# Patient Record
Sex: Female | Born: 1980
Health system: Southern US, Community
[De-identification: ages and names within clinical notes are randomized; demographics above are authoritative.]

## PROBLEM LIST (undated history)

## (undated) DIAGNOSIS — G4730 Sleep apnea, unspecified: Secondary | ICD-10-CM

## (undated) DIAGNOSIS — D219 Benign neoplasm of connective and other soft tissue, unspecified: Secondary | ICD-10-CM

## (undated) DIAGNOSIS — R519 Headache, unspecified: Secondary | ICD-10-CM

## (undated) DIAGNOSIS — R7303 Prediabetes: Secondary | ICD-10-CM

## (undated) DIAGNOSIS — I10 Essential (primary) hypertension: Secondary | ICD-10-CM

## (undated) HISTORY — DX: Morbid (severe) obesity due to excess calories: E66.01

## (undated) HISTORY — PX: NO PAST SURGERIES: SHX2092

## (undated) HISTORY — DX: Benign neoplasm of connective and other soft tissue, unspecified: D21.9

---

## 2016-01-11 ENCOUNTER — Emergency Department (HOSPITAL_COMMUNITY): Payer: Medicaid Other

## 2016-01-11 ENCOUNTER — Emergency Department (HOSPITAL_COMMUNITY)
Admission: EM | Admit: 2016-01-11 | Discharge: 2016-01-11 | Disposition: A | Discharge status: Home / Self Care | Payer: Medicaid Other | Attending: Emergency Medicine | Admitting: Emergency Medicine

## 2016-01-11 ENCOUNTER — Encounter (HOSPITAL_COMMUNITY): Payer: Self-pay | Admitting: *Deleted

## 2016-01-11 DIAGNOSIS — I10 Essential (primary) hypertension: Secondary | ICD-10-CM | POA: Diagnosis not present

## 2016-01-11 DIAGNOSIS — R079 Chest pain, unspecified: Secondary | ICD-10-CM

## 2016-01-11 DIAGNOSIS — Z79899 Other long term (current) drug therapy: Secondary | ICD-10-CM | POA: Diagnosis not present

## 2016-01-11 HISTORY — DX: Essential (primary) hypertension: I10

## 2016-01-11 LAB — BASIC METABOLIC PANEL
Anion gap: 5 (ref 5–15)
BUN: 17 mg/dL (ref 6–20)
CALCIUM: 8.7 mg/dL — AB (ref 8.9–10.3)
CHLORIDE: 103 mmol/L (ref 101–111)
CO2: 28 mmol/L (ref 22–32)
CREATININE: 1.12 mg/dL — AB (ref 0.44–1.00)
Glucose, Bld: 93 mg/dL (ref 65–99)
Potassium: 4 mmol/L (ref 3.5–5.1)
SODIUM: 136 mmol/L (ref 135–145)

## 2016-01-11 LAB — CBC
HCT: 37.1 % (ref 36.0–46.0)
Hemoglobin: 12.1 g/dL (ref 12.0–15.0)
MCH: 25.6 pg — ABNORMAL LOW (ref 26.0–34.0)
MCHC: 32.6 g/dL (ref 30.0–36.0)
MCV: 78.6 fL (ref 78.0–100.0)
Platelets: 294 10*3/uL (ref 150–400)
RBC: 4.72 MIL/uL (ref 3.87–5.11)
RDW: 14.3 % (ref 11.5–15.5)
WBC: 7.6 10*3/uL (ref 4.0–10.5)

## 2016-01-11 LAB — I-STAT TROPONIN, ED: Troponin i, poc: 0.01 ng/mL (ref 0.00–0.08)

## 2016-01-11 MED ORDER — KETOROLAC TROMETHAMINE 30 MG/ML IJ SOLN
15.0000 mg | Freq: Once | INTRAMUSCULAR | Status: AC
  Administered 2016-01-11: 15 mg via INTRAVENOUS
  Filled 2016-01-11: qty 1

## 2016-01-11 MED ORDER — NAPROXEN 500 MG PO TABS: 500.0000 mg | ORAL_TABLET | Freq: Two times a day (BID) | ORAL | Status: DC

## 2016-01-11 NOTE — ED Notes (Signed)
Pt reports L cp with SOB which started earlier today.  Pt reports pain is non-radiating and describes pain as sharp shooting pain.

## 2016-01-11 NOTE — ED Provider Notes (Signed)
CSN: [id number]     Arrival date & time 01/11/16  1812 History   First MD Initiated Contact with Patient 01/11/16 1932     Chief Complaint  Patient presents with  . Chest Pain     (Consider location/radiation/quality/duration/timing/severity/associated sxs/prior Treatment) HPI  Patient is a 35 year old female with past medical history hypertension and hyperlipidemia who presents the ED with complaint of left sided chest pain, onset 5 AM. Patient reports having a gradual onset of constant sharp left-sided chest pain which she notes has worsened throughout the day. She notes the pain is worse with movement or taking a deep breath. Pt reports feeling SOB due to her CP worsening when taking a deep breath. Denies radiation. Patient reports she has had similar chest pain in the past and notes she was [place] the [place] state, denies any known cause of her CP. Denies fever, chills, headache, lightheadedness, dizziness, diaphoresis, cough, wheezing, abdominal pain, N/V, numbness, tingling, weakness, leg swelling. Denies smoking. Endorses family hx of heart disease.   Past Medical History  Diagnosis Date  . Hypertension    History reviewed. No pertinent past surgical history. No family history on file. Social History  Substance Use Topics  . Smoking status: Never Smoker   . Smokeless tobacco: None  . Alcohol Use: No   OB History    No data available     Review of Systems  Respiratory: Positive for shortness of breath (due to CP).   Cardiovascular: Positive for chest pain.  All other systems reviewed and are negative.     Allergies  Review of patient's allergies indicates no known allergies.  Home Medications   Prior to Admission medications   Medication Sig Start Date End Date Taking? Authorizing Provider  naproxen (NAPROSYN) 500 MG tablet Take 1 tablet (500 mg total) by mouth 2 (two) times daily. 01/11/16   Chesley Noon Ketra Duchesne, PA-C   BP 144/92 mmHg  Pulse 65   Temp(Src) 97.7 F (36.5 C) (Oral)  Resp 20  Ht 5\' 3"  (1.6 m)  Wt 117.935 kg  BMI 46.07 kg/m2  SpO2 98%  LMP 12/12/2015 Physical Exam  Constitutional: She is oriented to person, place, and time. She appears well-developed and well-nourished. No distress.  Morbidly obese female  HENT:  Head: Normocephalic and atraumatic.  Mouth/Throat: Oropharynx is clear and moist. No oropharyngeal exudate.  Eyes: Conjunctivae and EOM are normal. Right eye exhibits no discharge. Left eye exhibits no discharge. No scleral icterus.  Neck: Normal range of motion. Neck supple.  Cardiovascular: Normal rate, regular rhythm, normal heart sounds and intact distal pulses.   Pulmonary/Chest: Effort normal and breath sounds normal. No respiratory distress. She has no wheezes. She has no rales. She exhibits tenderness (TTP over left mid-sternum).  Abdominal: Soft. Bowel sounds are normal. There is no tenderness.  Musculoskeletal: Normal range of motion. She exhibits no edema.  Neurological: She is alert and oriented to person, place, and time.  Skin: Skin is warm and dry. She is not diaphoretic.  Nursing note and vitals reviewed.   ED Course  Procedures (including critical care time) Labs Review Labs Reviewed  BASIC METABOLIC PANEL - Abnormal; Notable for the following:    Creatinine, Ser 1.12 (*)    Calcium 8.7 (*)    All other components within normal limits  CBC - Abnormal; Notable for the following:    MCH 25.6 (*)    All other components within normal limits  Randolm Idol, ED  Imaging Review Dg Chest 2 View  01/11/2016  CLINICAL DATA:  Shortness of breath and left-sided chest pain, initial encounter EXAM: CHEST  2 VIEW COMPARISON:  None. FINDINGS: The heart size and mediastinal contours are within normal limits. Both lungs are clear. The visualized skeletal structures are unremarkable. IMPRESSION: No active cardiopulmonary disease. Electronically Signed   By: Inez Catalina M.D.   On: 01/11/2016  18:44   I have personally reviewed and evaluated these images and lab results as part of my medical decision-making.   EKG Interpretation None      MDM   Final diagnoses:  Chest pain, unspecified chest pain type    Patient presented with left-sided chest pain with associated shortness of breath due to pain, pain worse with movement. History of hypertension, high cholesterol and obesity. Endorses family hx of heart disease. VSS. Exam revealed tenderness palpation over left anterior chest wall, remaining exam unremarkable. EKG showed sinus tachycardia. Troponin negative. Cr 1.12, no prior values to compare to see if this is pt's baseline value, remaining labs unremarkable. CXR negative. HEART score 2. PERC negative. I have a low suspicion for ACS, PE, dissection, or other acute cardiac event at this time. Pt given toradol. On reevaluation patient reports her chest pain has improved. Discussed results and plan for discharge with patient. Plan to discharge patient home with NSAIDs and symptomatically treatment. Patient given information to follow up with PCP and cardiology outpatient regarding her mildly elevated Cr and chest pain.     Chesley Noon [place], [place] 01/11/16 2100  Daleen Bo, MD 01/12/16 1226

## 2016-01-11 NOTE — Discharge Instructions (Signed)
Take your medication as prescribed as needed for your chest pain. I recommend refraining from lifting or doing any particular movements/activities that worsen your chest pain. Please follow up with a primary care provider from the Resource Guide provided below in 5-7 days if your chest pain has not improved. I also recommend following up with the cardiology clinic listed above if your chest pain continues for further evaluation. Please return to the Emergency Department if symptoms worsen or new onset of fever, headache, lightheadedness, dizziness, visual changes, difficulty breathing, productive cough, vomiting, numbness, tingling, weakness, syncope.

## 2016-12-18 ENCOUNTER — Emergency Department (HOSPITAL_COMMUNITY)
Admission: EM | Admit: 2016-12-18 | Discharge: 2016-12-18 | Disposition: A | Discharge status: Home / Self Care | Payer: Medicaid Other | Attending: Emergency Medicine | Admitting: Emergency Medicine

## 2016-12-18 ENCOUNTER — Encounter (HOSPITAL_COMMUNITY): Payer: Self-pay | Admitting: Emergency Medicine

## 2016-12-18 DIAGNOSIS — G4489 Other headache syndrome: Secondary | ICD-10-CM

## 2016-12-18 DIAGNOSIS — I10 Essential (primary) hypertension: Secondary | ICD-10-CM | POA: Insufficient documentation

## 2016-12-18 DIAGNOSIS — R51 Headache: Secondary | ICD-10-CM | POA: Diagnosis present

## 2016-12-18 LAB — BASIC METABOLIC PANEL
ANION GAP: 8 (ref 5–15)
BUN: 16 mg/dL (ref 6–20)
CALCIUM: 8.8 mg/dL — AB (ref 8.9–10.3)
CO2: 28 mmol/L (ref 22–32)
CREATININE: 1.04 mg/dL — AB (ref 0.44–1.00)
Chloride: 104 mmol/L (ref 101–111)
GFR calc Af Amer: >60 mL/min (ref >60)
GLUCOSE: 90 mg/dL (ref 65–99)
Potassium: 3.7 mmol/L (ref 3.5–5.1)
Sodium: 140 mmol/L (ref 135–145)

## 2016-12-18 LAB — CBC WITH DIFFERENTIAL/PLATELET
BASOS ABS: 0 10*3/uL (ref 0.0–0.1)
Basophils Relative: 0 %
EOS PCT: 2 %
Eosinophils Absolute: 0.2 10*3/uL (ref 0.0–0.7)
HCT: 38.6 % (ref 36.0–46.0)
Hemoglobin: 12.3 g/dL (ref 12.0–15.0)
LYMPHS PCT: 28 %
Lymphs Abs: 1.9 10*3/uL (ref 0.7–4.0)
MCH: 25.9 pg — AB (ref 26.0–34.0)
MCHC: 31.9 g/dL (ref 30.0–36.0)
MCV: 81.4 fL (ref 78.0–100.0)
MONO ABS: 0.3 10*3/uL (ref 0.1–1.0)
MONOS PCT: 5 %
NEUTROS ABS: 4.5 10*3/uL (ref 1.7–7.7)
Neutrophils Relative %: 65 %
Platelets: 278 10*3/uL (ref 150–400)
RBC: 4.74 MIL/uL (ref 3.87–5.11)
RDW: 14.5 % (ref 11.5–15.5)
WBC: 6.9 10*3/uL (ref 4.0–10.5)

## 2016-12-18 MED ORDER — METOCLOPRAMIDE HCL 5 MG/ML IJ SOLN
10.0000 mg | Freq: Once | INTRAMUSCULAR | Status: AC
  Administered 2016-12-18: 10 mg via INTRAVENOUS
  Filled 2016-12-18: qty 2

## 2016-12-18 MED ORDER — SODIUM CHLORIDE 0.9 % IV SOLN: INTRAVENOUS | Status: DC

## 2016-12-18 MED ORDER — LORAZEPAM 2 MG/ML IJ SOLN
0.5000 mg | Freq: Once | INTRAMUSCULAR | Status: AC
  Administered 2016-12-18: 0.5 mg via INTRAVENOUS
  Filled 2016-12-18: qty 1

## 2016-12-18 MED ORDER — DIPHENHYDRAMINE HCL 50 MG/ML IJ SOLN
12.5000 mg | Freq: Once | INTRAMUSCULAR | Status: AC
  Administered 2016-12-18: 12.5 mg via INTRAVENOUS
  Filled 2016-12-18: qty 1

## 2016-12-18 NOTE — ED Notes (Signed)
rn will collect labs at IV start 

## 2016-12-18 NOTE — ED Triage Notes (Signed)
Patient c/o headache, sinus pain and around eyes since yesterday. patient reports eyes have clear drainage.

## 2016-12-18 NOTE — ED Provider Notes (Signed)
[facility] DEPT Provider Note   CSN: [id number] Arrival date & time: 12/18/16  7412     History   Chief Complaint Chief Complaint  Patient presents with  . headache    HPI Rhodie Cienfuegos is a 36 y.o. female.  36 year old female presents with headache which is been left-sided 2 days. Headache starts on the left side of her face and goes on to her neck. Does have a prior history of migraines and she states that this is similar. Denies any confusion. Patient denies any visual loss or neck pain. Has had some photophobia but denies any phonophobia. Denies any weakness to her arms or legs. Headache has been persistent and nothing seems to make it better or worse. No treatment use prior to arrival.      Past Medical History:  Diagnosis Date  . Hypertension     There are no active problems to display for this patient.   History reviewed. No pertinent surgical history.  OB History    No data available       Home Medications    Prior to Admission medications   Medication Sig Start Date End Date Taking? Authorizing Provider  acetaminophen (TYLENOL) 500 MG tablet Take 1,000 mg by mouth every 6 (six) hours as needed (headache).   Yes Historical Provider, MD  levonorgestrel (MIRENA, 52 MG,) 20 MCG/24HR IUD 1 each by Intrauterine route once.   Yes Historical Provider, MD    Family History No family history on file.  Social History Social History  Substance Use Topics  . Smoking status: Never Smoker  . Smokeless tobacco: Never Used  . Alcohol use No     Allergies   Patient has no known allergies.   Review of Systems Review of Systems  All other systems reviewed and are negative.    Physical Exam Updated Vital Signs BP (!) 200/110 (BP Location: Right Arm)   Pulse 80   Temp 97.5 F (36.4 C) (Oral)   Resp 19   LMP 12/13/2016   SpO2 100%   Physical Exam  Constitutional: She is oriented to person, place, and time. She appears well-developed and  well-nourished.  Non-toxic appearance. No distress.  HENT:  Head: Normocephalic and atraumatic.  Eyes: Conjunctivae, EOM and lids are normal. Pupils are equal, round, and reactive to light.  Neck: Normal range of motion. Neck supple. No tracheal deviation present. No thyroid mass present.  Cardiovascular: Normal rate, regular rhythm and normal heart sounds.  Exam reveals no gallop.   No murmur heard. Pulmonary/Chest: Effort normal and breath sounds normal. No stridor. No respiratory distress. She has no decreased breath sounds. She has no wheezes. She has no rhonchi. She has no rales.  Abdominal: Soft. Normal appearance and bowel sounds are normal. She exhibits no distension. There is no tenderness. There is no rebound and no CVA tenderness.  Musculoskeletal: Normal range of motion. She exhibits no edema or tenderness.  Neurological: She is alert and oriented to person, place, and time. She has normal strength. No cranial nerve deficit or sensory deficit. GCS eye subscore is 4. GCS verbal subscore is 5. GCS motor subscore is 6.  Skin: Skin is warm and dry. No abrasion and no rash noted.  Psychiatric: She has a normal mood and affect. Her speech is normal and behavior is normal.  Nursing note and vitals reviewed.    ED Treatments / Results  Labs (all labs ordered are listed, but only abnormal results are displayed) Labs Reviewed  CBC WITH  DIFFERENTIAL/PLATELET  BASIC METABOLIC PANEL    EKG  EKG Interpretation None       Radiology No results found.  Procedures Procedures (including critical care time)  Medications Ordered in ED Medications  0.9 %  sodium chloride infusion (not administered)  metoCLOPramide (REGLAN) injection 10 mg (not administered)  diphenhydrAMINE (BENADRYL) injection 12.5 mg (not administered)  LORazepam (ATIVAN) injection 0.5 mg (not administered)     Initial Impression / Assessment and Plan / ED Course  I have reviewed the triage vital signs and the  nursing notes.  Pertinent labs & imaging results that were available during my care of the patient were reviewed by me and considered in my medical decision making (see chart for details).     Patient's headache treated here and she feels better. Suspect that she has a migraine. Will be discharged home  Final Clinical Impressions(s) / ED Diagnoses   Final diagnoses:  None    New Prescriptions New Prescriptions   No medications on file     Lacretia Leigh, MD 12/18/16 1225

## 2016-12-19 ENCOUNTER — Encounter: Payer: Self-pay | Admitting: Family Medicine

## 2016-12-19 ENCOUNTER — Ambulatory Visit (INDEPENDENT_AMBULATORY_CARE_PROVIDER_SITE_OTHER): Payer: Self-pay | Admitting: Family Medicine

## 2016-12-19 VITALS — BP 145/82 | HR 98 | Temp 98.0°F | Resp 16 | Ht 63.0 in | Wt 337.0 lb

## 2016-12-19 DIAGNOSIS — IMO0001 Reserved for inherently not codable concepts without codable children: Secondary | ICD-10-CM

## 2016-12-19 DIAGNOSIS — R030 Elevated blood-pressure reading, without diagnosis of hypertension: Secondary | ICD-10-CM

## 2016-12-19 DIAGNOSIS — E669 Obesity, unspecified: Secondary | ICD-10-CM

## 2016-12-19 DIAGNOSIS — Z8669 Personal history of other diseases of the nervous system and sense organs: Secondary | ICD-10-CM

## 2016-12-19 DIAGNOSIS — Z6841 Body Mass Index (BMI) 40.0 and over, adult: Secondary | ICD-10-CM

## 2016-12-19 DIAGNOSIS — Z23 Encounter for immunization: Secondary | ICD-10-CM

## 2016-12-19 LAB — POCT URINALYSIS DIP (DEVICE)
BILIRUBIN URINE: NEGATIVE
Glucose, UA: NEGATIVE mg/dL
Ketones, ur: NEGATIVE mg/dL
Nitrite: NEGATIVE
PH: 5.5 (ref 5.0–8.0)
PROTEIN: NEGATIVE mg/dL
Specific Gravity, Urine: 1.025 (ref 1.005–1.030)
UROBILINOGEN UA: 0.2 mg/dL (ref 0.0–1.0)

## 2016-12-19 LAB — LIPID PANEL
CHOL/HDL RATIO: 4 ratio (ref <5.0)
Cholesterol: 144 mg/dL (ref <200)
HDL: 36 mg/dL — AB (ref >50)
LDL Cholesterol: 89 mg/dL (ref <100)
Triglycerides: 97 mg/dL (ref <150)
VLDL: 19 mg/dL (ref <30)

## 2016-12-19 LAB — THYROID PANEL WITH TSH
Free Thyroxine Index: 2.4 (ref 1.4–3.8)
T3 Uptake: 28 % (ref 22–35)
T4, Total: 8.4 ug/dL (ref 4.5–12.0)
TSH: 0.94 m[IU]/L

## 2016-12-19 MED ORDER — SUMATRIPTAN SUCCINATE 100 MG PO TABS: 100.0000 mg | ORAL_TABLET | ORAL | 0 refills | Status: DC | PRN

## 2016-12-19 MED FILL — SUMATRIPTAN SUCC 100 MG TAB: 100 | 30 days supply | Qty: 10 | Fill #0

## 2016-12-19 NOTE — Progress Notes (Signed)
Patient ID: Chloe Castillo, female    DOB: [birth year], 36 y.o.   MRN: [id number]  PCP: Scot Jun, FNP  Chief Complaint  Patient presents with  . Establish Care    hospr f/u    Subjective:  HPI Chloe Castillo is a 36 y.o. female presents to establish care and hospital follow-up. 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  Chloe Castillo presented to [facility] Emergency Department 12/18/16 for a persistent headache x 2 days. She was evaluated and treated for a migraine headache, and discharged that same day.  While at the emergency department ,Chloe Castillo's blood pressure was elevated. She reports a history of gestational hypertension for which she was treated with antihypertension medication for 1 year. Prior to recent elevated blood pressure reading recorded in the ED, Chloe Castillo denies knowledge of any other elevated readings. She reports a positive family history of hypertension with both parents medically managed for blood pressure management.  Headaches Chloe Castillo was treated in her early 20's for migraine headaches. In the past she has experienced sensitivity to light, nausea, without vomiting. Headaches typically relieved with motrin and tylenol.  She has never taken any prescribed medication for headache prevention or abortive therapy. Since being discharged from the emergency department on 12/18/16, she has not experienced another headache.   Social History   Social History  . Marital status: Single    Spouse name: N/A  . Number of children: N/A  . Years of education: N/A   Occupational History  . Not on file.   Social History Main Topics  . Smoking status: Never Smoker  . Smokeless tobacco: Never Used  . Alcohol use No  . Drug use: No  . Sexual activity: Not on file   Other Topics Concern  . Not on file   Social History Narrative  . No narrative on file   Review of Systems See HPI  Prior to Admission  medications   Medication Sig Start Date End Date Taking? Authorizing Provider  acetaminophen (TYLENOL) 500 MG tablet Take 1,000 mg by mouth every 6 (six) hours as needed (headache).   Yes Historical Provider, MD  levonorgestrel (MIRENA, 52 MG,) 20 MCG/24HR IUD 1 each by Intrauterine route once.   Yes Historical Provider, MD    Past Medical, Surgical Family and Social History reviewed and updated.    Objective:   Blood pressure (!) 145/82, pulse 98, temperature 98 F (36.7 C), temperature source Oral, resp. rate 16, height 5\' 3"  (1.6 m), weight (!) 337 lb (152.9 kg), last menstrual period 12/13/2016, SpO2 98 %.  Wt Readings from Last 3 Encounters:  12/19/16 (!) 337 lb (152.9 kg)  01/11/16 260 lb (117.9 kg)   Physical Exam  Constitutional: She is oriented to person, place, and time. She appears well-developed and well-nourished.  HENT:  Head: Normocephalic and atraumatic.  Nose: Nose normal.  Mouth/Throat: Oropharynx is clear and moist.  Eyes: Conjunctivae and EOM are normal. Pupils are equal, round, and reactive to light.  Neck: Normal range of motion. Neck supple. Thyromegaly present.  Cardiovascular: Normal rate, regular rhythm, normal heart sounds and intact distal pulses.   Pulmonary/Chest: Effort normal and breath sounds normal.  Abdominal: Soft. Bowel sounds are normal.  Musculoskeletal: Normal range of motion.  Neurological: She is alert and oriented to person, place, and time.  Skin: Skin is warm and dry.  Psychiatric: She has a normal mood and affect. Her behavior is normal. Judgment and thought content  normal.    Assessment & Plan:  1. Elevated blood pressure reading without diagnosis of hypertension - Hemoglobin A1c - Thyroid Panel With TSH - Lipid panel  2. Hx of migraine headaches -for acute onset of headaches, take Sumatriptan 100 mg PRN as needed. Not exceed more than 200 mg within 2 hours.  3. Class 3 obesity with body mass index (BMI) of 45.0 to 49.9 in  adult, unspecified obesity type, unspecified whether serious comorbidity present (HCC) -Increase physical activity   4. Need for diphtheria-tetanus-pertussis (Tdap) vaccine - Tdap vaccine greater than or equal to 7yo IM   RTC: 1 week for blood pressure recheck 3 months for PAP, re-evaluate headaches, and evaluate blood pressure  Carroll Sage. Kenton Kingfisher, MSN, [facility] [address] [place], [place] [address] [phone]

## 2016-12-19 NOTE — Patient Instructions (Addendum)
Take sumatriptan as prescribed for headaches.  Return in 3 months for pap and blood pressure follow-up.  Return in 1 week for blood pressure recheck.

## 2016-12-20 LAB — HEMOGLOBIN A1C
Hgb A1c MFr Bld: 5.8 % — ABNORMAL HIGH (ref <5.7)
MEAN PLASMA GLUCOSE: 120 mg/dL

## 2016-12-24 DIAGNOSIS — Z6841 Body Mass Index (BMI) 40.0 and over, adult: Secondary | ICD-10-CM

## 2016-12-24 DIAGNOSIS — R030 Elevated blood-pressure reading, without diagnosis of hypertension: Secondary | ICD-10-CM | POA: Insufficient documentation

## 2016-12-24 DIAGNOSIS — Z8669 Personal history of other diseases of the nervous system and sense organs: Secondary | ICD-10-CM | POA: Insufficient documentation

## 2016-12-25 ENCOUNTER — Ambulatory Visit: Payer: Self-pay | Admitting: Family Medicine

## 2016-12-25 VITALS — BP 100/72

## 2016-12-25 DIAGNOSIS — Z0130 Encounter for examination of blood pressure without abnormal findings: Secondary | ICD-10-CM

## 2017-01-31 ENCOUNTER — Encounter (HOSPITAL_COMMUNITY): Payer: Self-pay | Admitting: Emergency Medicine

## 2017-01-31 ENCOUNTER — Emergency Department (HOSPITAL_COMMUNITY)
Admission: EM | Admit: 2017-01-31 | Discharge: 2017-01-31 | Disposition: A | Discharge status: Home / Self Care | Payer: Self-pay | Attending: Emergency Medicine | Admitting: Emergency Medicine

## 2017-01-31 ENCOUNTER — Emergency Department (HOSPITAL_COMMUNITY): Payer: Self-pay

## 2017-01-31 DIAGNOSIS — M545 Low back pain, unspecified: Secondary | ICD-10-CM

## 2017-01-31 DIAGNOSIS — M62830 Muscle spasm of back: Secondary | ICD-10-CM | POA: Insufficient documentation

## 2017-01-31 DIAGNOSIS — Y9389 Activity, other specified: Secondary | ICD-10-CM | POA: Insufficient documentation

## 2017-01-31 DIAGNOSIS — I10 Essential (primary) hypertension: Secondary | ICD-10-CM | POA: Insufficient documentation

## 2017-01-31 DIAGNOSIS — Y929 Unspecified place or not applicable: Secondary | ICD-10-CM | POA: Insufficient documentation

## 2017-01-31 DIAGNOSIS — Y999 Unspecified external cause status: Secondary | ICD-10-CM | POA: Insufficient documentation

## 2017-01-31 DIAGNOSIS — S8392XA Sprain of unspecified site of left knee, initial encounter: Secondary | ICD-10-CM | POA: Insufficient documentation

## 2017-01-31 DIAGNOSIS — W19XXXA Unspecified fall, initial encounter: Secondary | ICD-10-CM

## 2017-01-31 DIAGNOSIS — S93401A Sprain of unspecified ligament of right ankle, initial encounter: Secondary | ICD-10-CM | POA: Insufficient documentation

## 2017-01-31 DIAGNOSIS — W010XXA Fall on same level from slipping, tripping and stumbling without subsequent striking against object, initial encounter: Secondary | ICD-10-CM | POA: Insufficient documentation

## 2017-01-31 MED ORDER — NAPROXEN 500 MG PO TABS: 500.0000 mg | ORAL_TABLET | Freq: Two times a day (BID) | ORAL | 0 refills | Status: DC | PRN

## 2017-01-31 MED ORDER — IBUPROFEN 800 MG PO TABS
800.0000 mg | ORAL_TABLET | Freq: Once | ORAL | Status: AC
  Administered 2017-01-31: 800 mg via ORAL
  Filled 2017-01-31: qty 1

## 2017-01-31 MED ORDER — CYCLOBENZAPRINE HCL 10 MG PO TABS: 10.0000 mg | ORAL_TABLET | Freq: Three times a day (TID) | ORAL | 0 refills | Status: DC | PRN

## 2017-01-31 NOTE — ED Provider Notes (Signed)
[facility] DEPT Provider Note   CSN: [id number] Arrival date & time: 01/31/17  1607  By signing my name below, I, Ephriam Jenkins, attest that this documentation has been prepared under the direction and in the presence of [employer]. Electronically Signed: Ephriam Jenkins, ED Scribe. 01/31/17. 5:26 PM.   History   Chief Complaint Chief Complaint  Patient presents with  . Fall  . Ankle Pain  . Back Pain  . Knee Pain    HPI HPI Comments: Chloe Castillo is a 36 y.o. female who presents to the Emergency Department complaining of right ankle, left knee, and lower back pain s/p 2 separate falls in the last 1.5wks, with the most recent one being 3 days ago. Pt states she was walking out of the freezer at her work when she slipped and fell which caused her legs to split, landing on her L knee extended behind her, and her R ankle extended forward. She states her ankle hurts the worst, which she describes as 10/10 sharp constant nonradiating R ankle pain, worse with bearing weight on the extremity, with no tx tried for any of her symptoms. She reports mild swelling in her L knee and R ankle. She further reports lower back pain only when bending over. Pt has not taken any medication in attempt to relieve her symptoms. She is not currently taking blood thinners. She denies head injury/LOC, bruising, wounds, fevers, chills, CP, SOB, abd pain, N/V/D/C, hematuria, dysuria, incontinence of urine/stool, saddle anesthesia/cauda equina symptoms, myalgias, numbness, tingling, focal weakness, or any other complaints at this time.    The history is provided by the patient and medical records. No language interpreter was used.  Ankle Pain   The incident occurred more than 2 days ago (3 days). The incident occurred at work. The injury mechanism was a fall. The pain is present in the right ankle and left knee. The quality of the pain is described as sharp. The pain is at a severity of 10/10. The pain is  moderate. The pain has been constant since onset. Pertinent negatives include no numbness, no inability to bear weight, no loss of motion, no muscle weakness, no loss of sensation and no tingling. She reports no foreign bodies present. The symptoms are aggravated by activity and bearing weight. She has tried nothing for the symptoms. The treatment provided no relief.    Past Medical History:  Diagnosis Date  . Hypertension     Patient Active Problem List   Diagnosis Date Noted  . Elevated blood pressure reading without diagnosis of hypertension 12/24/2016  . Hx of migraine headaches 12/24/2016  . Class 3 obesity with body mass index (BMI) of 45.0 to 49.9 in adult [facility]) 12/24/2016    History reviewed. No pertinent surgical history.  OB History    No data available       Home Medications    Prior to Admission medications   Medication Sig Start Date End Date Taking? Authorizing Provider  acetaminophen (TYLENOL) 500 MG tablet Take 1,000 mg by mouth every 6 (six) hours as needed (headache).    [provider]  levonorgestrel (MIRENA, 52 MG,) 20 MCG/24HR IUD 1 each by Intrauterine route once.    [provider]  SUMAtriptan (IMITREX) 100 MG tablet Take 1 tablet (100 mg total) by mouth every 2 (two) hours as needed for migraine. Do not exceed more than 200 mg in 24 hours 12/19/16   Scot Jun, FNP   Family History No family history  on file.  Social History Social History  Substance Use Topics  . Smoking status: Never Smoker  . Smokeless tobacco: Never Used  . Alcohol use No     Allergies   Patient has no known allergies.   Review of Systems Review of Systems  Constitutional: Negative for chills and fever.  HENT: Negative for facial swelling (no head inj).   Respiratory: Negative for shortness of breath.   Cardiovascular: Negative for chest pain.  Gastrointestinal: Negative for abdominal pain, constipation, diarrhea, nausea and vomiting.    Genitourinary: Negative for difficulty urinating (no incontinence), dysuria and hematuria.  Musculoskeletal: Positive for arthralgias (right ankle, left knee), back pain (lower) and joint swelling (left knee and R ankle). Negative for myalgias.  Skin: Negative for color change and wound.  Allergic/Immunologic: Negative for immunocompromised state.  Neurological: Negative for tingling, syncope, weakness and numbness.  Hematological: Does not bruise/bleed easily.  Psychiatric/Behavioral: Negative for confusion.  10 Systems reviewed and all are negative for acute change except as noted in the HPI.   Physical Exam Updated Vital Signs BP (!) 168/89 (BP Location: Left Wrist)   Pulse 77   Temp 98.1 F (36.7 C) (Oral)   Resp 18   Ht 5\' 4"  (1.626 m)   Wt 275 lb (124.7 kg)   SpO2 100%   BMI 47.20 kg/m   Physical Exam  Constitutional: She is oriented to person, place, and time. Vital signs are normal. She appears well-developed and well-nourished.  Non-toxic appearance. No distress.  Afebrile, nontoxic, NAD  HENT:  Head: Normocephalic and atraumatic.  Mouth/Throat: Mucous membranes are normal.  Eyes: Conjunctivae and EOM are normal. Right eye exhibits no discharge. Left eye exhibits no discharge.  Neck: Normal range of motion. Neck supple.  Cardiovascular: Normal rate and intact distal pulses.   Pulmonary/Chest: Effort normal. No respiratory distress.  Abdominal: Normal appearance. She exhibits no distension.  Musculoskeletal: Normal range of motion.       Left knee: She exhibits normal range of motion, no swelling, no effusion, no ecchymosis, no deformity, no laceration, no erythema, normal alignment, no LCL laxity, normal patellar mobility and no MCL laxity. Tenderness found. Medial joint line tenderness noted.       Right ankle: She exhibits swelling. She exhibits normal range of motion, no ecchymosis, no deformity, no laceration and normal pulse. Tenderness. Medial malleolus tenderness  found. Achilles tendon normal.       Lumbar back: She exhibits tenderness and spasm. She exhibits normal range of motion, no bony tenderness, no swelling and no deformity.  Right ankle with FROM intact, +swelling, no crepitus or deformity, with mild TTP of the medial malleolus, but no TTP or swelling of fore foot or calf. No break in skin. No bruising or erythema. No warmth. Achilles intact. Good pedal pulse and cap refill of all toes. Wiggling toes without difficulty. Left knee with FROM intact, mild medial joint line TTP, no swelling/effusion/deformity, no bruising or erythema, no warmth, no abnormal alignment or patellar mobility, no varus/valgus laxity, neg anterior drawer test, no crepitus.  Lumbar spine with FROM intact without spinous process TTP, no bony stepoffs or deformities, with mild bilateral paraspinous muscle TTP and muscle spasms. No overlying skin changes.  Strength and sensation grossly intact in all extremities, gait steady. Distal pulses intact, compartments soft in all extremities.   Neurological: She is alert and oriented to person, place, and time. She has normal strength. No sensory deficit.  Skin: Skin is warm, dry and intact. No rash  noted.  Psychiatric: She has a normal mood and affect. Her behavior is normal.  Nursing note and vitals reviewed.    ED Treatments / Results  DIAGNOSTIC STUDIES: Oxygen Saturation is 100% on RA, normal by my interpretation.  COORDINATION OF CARE: 5:28 PM-Discussed treatment plan with pt at bedside and pt agreed to plan.   Labs (all labs ordered are listed, but only abnormal results are displayed) Labs Reviewed - No data to display  EKG  EKG Interpretation None       Radiology Dg Ankle Complete Right  Result Date: 01/31/2017 CLINICAL DATA:  Medial right ankle pain after fall 4 days ago. EXAM: RIGHT ANKLE - COMPLETE 3+ VIEW COMPARISON:  None. FINDINGS: Soft tissue swelling about the malleoli. Intact ankle mortise without  dislocation. The subtalar and midfoot articulations are maintained with minimal degenerative changes along the dorsum of the midfoot and talonavicular articulation. Prominent enthesophytes are seen along the plantar and dorsal aspect of the right calcaneus. IMPRESSION: 1. Soft tissue swelling about the right ankle. 2. No acute fracture or dislocations. 3. Calcaneal enthesophytes. Electronically Signed   By: Ashley Royalty M.D.   On: 01/31/2017 17:25   Dg Knee Complete 4 Views Left  Result Date: 01/31/2017 CLINICAL DATA:  Anterior left knee pain after fall 4 days ago. EXAM: LEFT KNEE - COMPLETE 4+ VIEW COMPARISON:  None. FINDINGS: No evidence of fracture, dislocation, or joint effusion. No evidence of arthropathy or other focal bone abnormality. Pretibial soft tissue swelling is seen of the proximal left leg. IMPRESSION: Pretibial soft tissue swelling. No underlying fracture. No dislocation. Electronically Signed   By: Ashley Royalty M.D.   On: 01/31/2017 17:26    Procedures Procedures (including critical care time)  Medications Ordered in ED Medications  ibuprofen (ADVIL,MOTRIN) tablet 800 mg (800 mg Oral Given 01/31/17 1733)     Initial Impression / Assessment and Plan / ED Course  I have reviewed the triage vital signs and the nursing notes.  Pertinent labs & imaging results that were available during my care of the patient were reviewed by me and considered in my medical decision making (see chart for details).     36 y.o. female here with R ankle pain, L knee pain, and back pain after 2 separate mechanical falls in the last 1.5wks. On exam, no midline spinal tenderness, no s/sx of cord compression, mild paraspinous lumbar muscle TTP and spasm; mild medial joint line R ankle tenderness and slight swelling, L knee with mild medial joint line tenderness without swelling. No bruising or skin changes. NVI with soft compartments. Will obtain xray of L knee and R ankle, but doubt need for imaging of  back. Will give ibuprofen then reassess shortly.   5:50 PM Xrays negative. Likely sprains of both areas. Will apply knee sleeve and ASO brace for support/compression, give crutches for comfort. Advised use of tylenol, naprosyn rx given, flexeril rx given, discussed RICE for ankle/knee and heat use to back, and f/up with ortho in 1-2wks for recheck of symptoms. I explained the diagnosis and have given explicit precautions to return to the ER including for any other new or worsening symptoms. The patient understands and accepts the medical plan as it's been dictated and I have answered their questions. Discharge instructions concerning home care and prescriptions have been given. The patient is STABLE and is discharged to home in good condition.   I personally performed the services described in this documentation, which was scribed in my presence. The recorded  information has been reviewed and is accurate.    Final Clinical Impressions(s) / ED Diagnoses   Final diagnoses:  Sprain of right ankle, unspecified ligament, initial encounter  Sprain of left knee, unspecified ligament, initial encounter  Acute bilateral low back pain without sciatica  Back muscle spasm  Fall, initial encounter    New Prescriptions New Prescriptions   CYCLOBENZAPRINE (FLEXERIL) 10 MG TABLET    Take 1 tablet (10 mg total) by mouth 3 (three) times daily as needed for muscle spasms.   NAPROXEN (NAPROSYN) 500 MG TABLET    Take 1 tablet (500 mg total) by mouth 2 (two) times daily as needed for mild pain, moderate pain or headache (TAKE WITH MEALS.).     [address], [place], [place] 01/31/17 1755    Lacretia Leigh, MD 02/01/17 [phone]

## 2017-01-31 NOTE — ED Notes (Signed)
Pt ambulatory and independent at discharge.  Verbalized understanding of discharge instructions 

## 2017-01-31 NOTE — ED Triage Notes (Signed)
Pt reports falling twice over the past week and a half.  The first fall resulted in no injury.  Pt states the most recent fall was on Sunday after coming out of the freezing at work slipped and fell. Reports right ankle pain and left knee pain.  Reports also pain in her back when she bends over.  Denies being on blood thinners or LOC.

## 2017-01-31 NOTE — Discharge Instructions (Signed)
Wear ankle brace for at least 2 weeks for stabilization of ankle. Use knee sleeve as needed for comfort and compression. Use crutches as needed for comfort. Ice and elevate ankle and knee throughout the day, using ice pack for no more than 20 minutes every hour.  Use heat to the area of soreness in your back. Alternate between naprosyn and tylenol for pain relief. Use flexeril as directed as needed for back muscle spasms. Do not drive or operate machinery with muscle relaxant use. Call orthopedic follow up today or tomorrow to schedule followup appointment for recheck of ongoing ankle and knee pain in 1-2 weeks. Return to the ER for changes or worsening symptoms.

## 2017-01-31 NOTE — ED Notes (Signed)
Ortho tech at bedside 

## 2017-01-31 NOTE — ED Notes (Signed)
Called ortho for knee sleeve, ankle brace, and crutches

## 2017-03-22 ENCOUNTER — Ambulatory Visit (INDEPENDENT_AMBULATORY_CARE_PROVIDER_SITE_OTHER): Payer: Self-pay | Admitting: Family Medicine

## 2017-03-22 ENCOUNTER — Other Ambulatory Visit (HOSPITAL_COMMUNITY)
Admission: RE | Admit: 2017-03-22 | Discharge: 2017-03-22 | Disposition: A | Discharge status: Home / Self Care | Payer: Self-pay | Source: Ambulatory Visit | Attending: Family Medicine | Admitting: Family Medicine

## 2017-03-22 ENCOUNTER — Encounter: Payer: Self-pay | Admitting: Family Medicine

## 2017-03-22 VITALS — BP 140/90 | HR 81 | Temp 98.6°F | Resp 16 | Ht 64.0 in | Wt 328.0 lb

## 2017-03-22 DIAGNOSIS — Z01419 Encounter for gynecological examination (general) (routine) without abnormal findings: Secondary | ICD-10-CM

## 2017-03-22 DIAGNOSIS — I10 Essential (primary) hypertension: Secondary | ICD-10-CM

## 2017-03-22 DIAGNOSIS — G8929 Other chronic pain: Secondary | ICD-10-CM

## 2017-03-22 DIAGNOSIS — R51 Headache: Secondary | ICD-10-CM

## 2017-03-22 LAB — POCT URINALYSIS DIP (DEVICE)
Bilirubin Urine: NEGATIVE
Glucose, UA: NEGATIVE mg/dL
Ketones, ur: NEGATIVE mg/dL
Nitrite: NEGATIVE
Protein, ur: NEGATIVE mg/dL
Specific Gravity, Urine: 1.025 (ref 1.005–1.030)
Urobilinogen, UA: 0.2 mg/dL (ref 0.0–1.0)
pH: 5.5 (ref 5.0–8.0)

## 2017-03-22 MED ORDER — LISINOPRIL 20 MG PO TABS: 20.0000 mg | ORAL_TABLET | Freq: Every day | ORAL | 1 refills | Status: DC

## 2017-03-22 NOTE — Progress Notes (Signed)
Patient ID: Chloe Castillo, female    DOB: June 26, 1981, 36 y.o.   MRN: [id number]  PCP: Scot Jun, FNP  Chief Complaint  Patient presents with  . Follow-up    3 month for headaches and blood pressure and pap    Subjective:  HPI Chloe Castillo is a 36 y.o. female presents for evaluation of headaches, blood pressure, and Pap exam.  Chloe Castillo was last seen in office on 12/19/2016 for evaluation of headache and elevated blood pressure. She was prescribed sumatriptan and naproxen for headache management. Today, Chloe Castillo reports improvement of headache frequency. She continues to experience at least 2 headaches per month and has not required a refill of medication since prescribed in May. Chloe Castillo has associated increased sodium intake as a trigger for development of headache. She doesn't routinely monitor her blood pressure and is uncertain of baseline readings. Denies any associated chest pain, shortness of breath, or dizziness. Chloe Castillo is also obtaining a gynecological exam today. She denies vaginal discharge, dysuria, or lesions. Patient's last menstrual period was 03/07/2017. She denies recent changes in sexual partners or recent exposure to STI. Social History   Social History  . Marital status: Single    Spouse name: N/A  . Number of children: N/A  . Years of education: N/A   Occupational History  . Not on file.   Social History Main Topics  . Smoking status: Never Smoker  . Smokeless tobacco: Never Used  . Alcohol use No  . Drug use: No  . Sexual activity: No   Other Topics Concern  . Not on file   Social History Narrative  . No narrative on file   Family History  Problem Relation Age of Onset  . Hypertension Mother   . Hypertension Father    Review of Systems See HPI  Patient Active Problem List   Diagnosis Date Noted  . Elevated blood pressure reading without diagnosis of hypertension 12/24/2016  . Hx of migraine headaches 12/24/2016  . Class 3 obesity with body mass index  (BMI) of 45.0 to 49.9 in adult [facility]) 12/24/2016    No Known Allergies  Prior to Admission medications   Medication Sig Start Date End Date Taking? Authorizing Provider  acetaminophen (TYLENOL) 500 MG tablet Take 1,000 mg by mouth every 6 (six) hours as needed (headache).   Yes [provider]  cyclobenzaprine (FLEXERIL) 10 MG tablet Take 1 tablet (10 mg total) by mouth 3 (three) times daily as needed for muscle spasms. 01/31/17  Yes Street, [place], PA-C  levonorgestrel (MIRENA, 52 MG,) 20 MCG/24HR IUD 1 each by Intrauterine route once.   Yes [provider]  naproxen (NAPROSYN) 500 MG tablet Take 1 tablet (500 mg total) by mouth 2 (two) times daily as needed for mild pain, moderate pain or headache (TAKE WITH MEALS.). 01/31/17  Yes Street, [place], PA-C  SUMAtriptan (IMITREX) 100 MG tablet Take 1 tablet (100 mg total) by mouth every 2 (two) hours as needed for migraine. Do not exceed more than 200 mg in 24 hours 12/19/16  Yes Scot Jun, FNP    Past Medical, Surgical Family and Social History reviewed and updated.    Objective:   Today's Vitals   03/22/17 1129  BP: 140/90  Pulse: 81  Resp: 16  Temp: 98.6 F (37 C)  TempSrc: Oral  SpO2: 98%  Weight: (!) 328 lb (148.8 kg)  Height: 5\' 4"  (1.626 m)    Wt Readings from Last 3 Encounters:  03/22/17 (!) 328  lb (148.8 kg)  01/31/17 275 lb (124.7 kg)  12/19/16 (!) 337 lb (152.9 kg)    Physical Exam  Constitutional: She is oriented to person, place, and time. She appears well-developed and well-nourished.  HENT:  Head: Normocephalic and atraumatic.  Eyes: Pupils are equal, round, and reactive to light. Conjunctivae are normal.  Neck: Normal range of motion. Neck supple.  Cardiovascular: Normal rate, regular rhythm, normal heart sounds and intact distal pulses.   Pulmonary/Chest: Effort normal and breath sounds normal.  Genitourinary:  Genitourinary Comments: Breasts are symmetric without cutaneous  changes, nipple inversion or discharge. No masses or tenderness, and no axillary lymphadenopathy. Normal female external genitalia without lesion. No inguinal lymphadenopathy. Vaginal mucosa is pink and moist without lesions. Cervix is pink, white-thicken present discharge, not friable. Pap smear obtained. No cervical motion tenderness, adnexal fullness or tenderness.  Neurological: She is alert and oriented to person, place, and time.  Skin: Skin is warm and dry.  Psychiatric: She has a normal mood and affect. Her behavior is normal. Judgment and thought content normal.   Assessment & Plan:  1. Encounter for annual routine gynecological examination - Cytology - PAP [place]  2. Essential hypertension, elevated today. -Start lisinopril 20 mg daily.   3. Chronic nonintractable headache, unspecified headache type -Continue current regimen. Will aim to achieve blood pressure control in efforts to reduce frequency of headaches.  RTC: 4 weeks for blood pressure recheck and 6 months for hypertension follow-up and lipid panel.   Carroll Sage. Kenton Kingfisher, MSN, FNP-C [facility]  [address] [place], [place] [address] [phone]

## 2017-03-22 NOTE — Patient Instructions (Addendum)
For high blood pressure, I am starting you on Lisinopril 20 mg once daily.  For prescription drug discounts, you can view [email or web address]   Hypertension Hypertension is another name for high blood pressure. High blood pressure forces your heart to work harder to pump blood. This can cause problems over time. There are two numbers in a blood pressure reading. There is a top number (systolic) over a bottom number (diastolic). It is best to have a blood pressure below 120/80. Healthy choices can help lower your blood pressure. You may need medicine to help lower your blood pressure if:  Your blood pressure cannot be lowered with healthy choices.  Your blood pressure is higher than 130/80.  Follow these instructions at home: Eating and drinking  If directed, follow the DASH eating plan. This diet includes: ? Filling half of your plate at each meal with fruits and vegetables. ? Filling one quarter of your plate at each meal with whole grains. Whole grains include whole wheat pasta, brown rice, and whole grain bread. ? Eating or drinking low-fat dairy products, such as skim milk or low-fat yogurt. ? Filling one quarter of your plate at each meal with low-fat (lean) proteins. Low-fat proteins include fish, skinless chicken, eggs, beans, and tofu. ? Avoiding fatty meat, cured and processed meat, or chicken with skin. ? Avoiding premade or processed food.  Eat less than 1,500 mg of salt (sodium) a day.  Limit alcohol use to no more than 1 drink a day for nonpregnant women and 2 drinks a day for men. One drink equals 12 oz of beer, 5 oz of wine, or 1 oz of hard liquor. Lifestyle  Work with your doctor to stay at a healthy weight or to lose weight. Ask your doctor what the best weight is for you.  Get at least 30 minutes of exercise that causes your heart to beat faster (aerobic exercise) most days of the week. This may include walking, swimming, or biking.  Get at least 30 minutes of exercise that  strengthens your muscles (resistance exercise) at least 3 days a week. This may include lifting weights or pilates.  Do not use any products that contain nicotine or tobacco. This includes cigarettes and e-cigarettes. If you need help quitting, ask your doctor.  Check your blood pressure at home as told by your doctor.  Keep all follow-up visits as told by your doctor. This is important. Medicines  Take over-the-counter and prescription medicines only as told by your doctor. Follow directions carefully.  Do not skip doses of blood pressure medicine. The medicine does not work as well if you skip doses. Skipping doses also puts you at risk for problems.  Ask your doctor about side effects or reactions to medicines that you should watch for. Contact a doctor if:  You think you are having a reaction to the medicine you are taking.  You have headaches that keep coming back (recurring).  You feel dizzy.  You have swelling in your ankles.  You have trouble with your vision. Get help right away if:  You get a very bad headache.  You start to feel confused.  You feel weak or numb.  You feel faint.  You get very bad pain in your: ? Chest. ? Belly (abdomen).  You throw up (vomit) more than once.  You have trouble breathing. Summary  Hypertension is another name for high blood pressure.  Making healthy choices can help lower blood pressure. If your blood pressure cannot be  controlled with healthy choices, you may need to take medicine. This information is not intended to replace advice given to you by your health care provider. Make sure you discuss any questions you have with your health care provider. Document Released: 01/24/2008 Document Revised: 07/05/2016 Document Reviewed: 07/05/2016 Elsevier Interactive Patient Education  2018 [employer].    Pap Test Why am I having this test? A pap test is sometimes called a pap smear. It is a screening test that is used to  check for signs of cancer of the vagina, cervix, and uterus. The test can also identify the presence of infection or precancerous changes. Your health care provider will likely recommend you have this test done on a regular basis. This test may be done:  Every 3 years, starting at age 68.  Every 5 years, in combination with testing for the presence of human papillomavirus (HPV).  More or less often depending on other medical conditions.  What kind of sample is taken? Using a small cotton swab, plastic spatula, or brush, your health care provider will collect a sample of cells from the surface of your cervix. Your cervix is the opening to your uterus, also called a womb. Secretions from the cervix and vagina may also be collected. How do I prepare for this test?  Be aware of where you are in your menstrual cycle. You may be asked to reschedule the test if you are menstruating on the day of the test.  You may need to reschedule if you have a known vaginal infection on the day of the test.  You may be asked to avoid douching or taking a bath the day before or the day of the test.  Some medicines can cause abnormal test results, such as digitalis and tetracycline. Talk with your health care provider before your test if you take one of these medicines. What do the results mean? Abnormal test results may indicate a number of health conditions. These may include:  Cancer. Although pap test results cannot be used to diagnose cancer of the cervix, vagina, or uterus, they may suggest the possibility of cancer. Further tests would be required to determine if cancer is present.  Sexually transmitted disease.  Fungal infection.  Parasite infection.  Herpes infection.  A condition causing or contributing to infertility.  It is your responsibility to obtain your test results. Ask the lab or department performing the test when and how you will get your results. Contact your health care provider to  discuss any questions you have about your results. Talk with your health care provider to discuss your results, treatment options, and if necessary, the need for more tests. Talk with your health care provider if you have any questions about your results. This information is not intended to replace advice given to you by your health care provider. Make sure you discuss any questions you have with your health care provider. Document Released: 10/28/2002 Document Revised: 04/12/2016 Document Reviewed: 12/29/2013 Elsevier Interactive Patient Education  [employer].

## 2017-03-23 LAB — CYTOLOGY - PAP
BACTERIAL VAGINITIS: POSITIVE — AB
CHLAMYDIA, DNA PROBE: NEGATIVE
Candida vaginitis: NEGATIVE
DIAGNOSIS: NEGATIVE
NEISSERIA GONORRHEA: NEGATIVE
Trichomonas: POSITIVE — AB

## 2017-03-24 MED ORDER — METRONIDAZOLE 500 MG PO TABS: 2000.0000 mg | ORAL_TABLET | Freq: Once | ORAL | 0 refills | Status: AC

## 2017-03-27 LAB — CERVICOVAGINAL ANCILLARY ONLY: HERPES (WINDOWPATH): NEGATIVE

## 2017-03-28 ENCOUNTER — Encounter: Payer: Self-pay | Admitting: Family Medicine

## 2017-05-03 ENCOUNTER — Ambulatory Visit (INDEPENDENT_AMBULATORY_CARE_PROVIDER_SITE_OTHER): Payer: Self-pay | Admitting: Family Medicine

## 2017-05-03 VITALS — BP 124/86

## 2017-05-03 DIAGNOSIS — Z0130 Encounter for examination of blood pressure without abnormal findings: Secondary | ICD-10-CM

## 2017-05-03 NOTE — Progress Notes (Signed)
124/86 

## 2017-06-18 ENCOUNTER — Encounter: Payer: Self-pay | Admitting: Family Medicine

## 2017-07-09 ENCOUNTER — Emergency Department
Admission: EM | Admit: 2017-07-09 | Discharge: 2017-07-09 | Disposition: A | Discharge status: Home / Self Care | Payer: Self-pay | Attending: Emergency Medicine | Admitting: Emergency Medicine

## 2017-07-09 ENCOUNTER — Other Ambulatory Visit: Payer: Self-pay

## 2017-07-09 ENCOUNTER — Encounter: Payer: Self-pay | Admitting: *Deleted

## 2017-07-09 DIAGNOSIS — Z79899 Other long term (current) drug therapy: Secondary | ICD-10-CM | POA: Insufficient documentation

## 2017-07-09 DIAGNOSIS — I10 Essential (primary) hypertension: Secondary | ICD-10-CM | POA: Insufficient documentation

## 2017-07-09 DIAGNOSIS — M7731 Calcaneal spur, right foot: Secondary | ICD-10-CM | POA: Insufficient documentation

## 2017-07-09 DIAGNOSIS — M2141 Flat foot [pes planus] (acquired), right foot: Secondary | ICD-10-CM | POA: Insufficient documentation

## 2017-07-09 DIAGNOSIS — M2142 Flat foot [pes planus] (acquired), left foot: Secondary | ICD-10-CM | POA: Insufficient documentation

## 2017-07-09 MED ORDER — NAPROXEN 500 MG PO TABS: 500.0000 mg | ORAL_TABLET | Freq: Two times a day (BID) | ORAL | 0 refills | Status: DC

## 2017-07-09 MED ORDER — TRAMADOL HCL 50 MG PO TABS: 50.0000 mg | ORAL_TABLET | Freq: Four times a day (QID) | ORAL | 0 refills | Status: DC | PRN

## 2017-07-09 NOTE — ED Provider Notes (Signed)
[facility] Emergency Department Provider Note   ____________________________________________   First MD Initiated Contact with Patient 07/09/17 1033     (approximate)  I have reviewed the triage vital signs and the nursing notes.   HISTORY  Chief Complaint Foot Injury    HPI Chloe Castillo is a 36 y.o. female patient complaining of right foot and ankle pain for 5 months. Patient states she had a slip and fall initial injury and was seen at [facility] long emergency room. Patient was diagnosed with sprain ankle.  Patient state pain is localized to the plantar and posterior aspect of her ankle.  Patient the pain increased with prolonged standing which required by her work.  Patient rates the pain as a 9/10.  Patient described the pain as "ache".  No palliative measured for complaint.   Past Medical History:  Diagnosis Date  . Hypertension     Patient Active Problem List   Diagnosis Date Noted  . Elevated blood pressure reading without diagnosis of hypertension 12/24/2016  . Hx of migraine headaches 12/24/2016  . Class 3 obesity with body mass index (BMI) of 45.0 to 49.9 in adult 12/24/2016    History reviewed. No pertinent surgical history.  Prior to Admission medications   Medication Sig Start Date End Date Taking? Authorizing Provider  acetaminophen (TYLENOL) 500 MG tablet Take 1,000 mg by mouth every 6 (six) hours as needed (headache).    [provider]  cyclobenzaprine (FLEXERIL) 10 MG tablet Take 1 tablet (10 mg total) by mouth 3 (three) times daily as needed for muscle spasms. 01/31/17   Street, [place], PA-C  levonorgestrel (MIRENA, 52 MG,) 20 MCG/24HR IUD 1 each by Intrauterine route once.    [provider]  lisinopril (PRINIVIL,ZESTRIL) 20 MG tablet Take 1 tablet (20 mg total) by mouth daily. 03/22/17   Scot Jun, FNP  naproxen (NAPROSYN) 500 MG tablet Take 1 tablet (500 mg total) by mouth 2 (two) times daily as needed  for mild pain, moderate pain or headache (TAKE WITH MEALS.). 01/31/17   Street, [place], PA-C  naproxen (NAPROSYN) 500 MG tablet Take 1 tablet (500 mg total) 2 (two) times daily with a meal by mouth. 07/09/17   Sable Feil, PA-C  SUMAtriptan (IMITREX) 100 MG tablet Take 1 tablet (100 mg total) by mouth every 2 (two) hours as needed for migraine. Do not exceed more than 200 mg in 24 hours 12/19/16   Scot Jun, FNP  traMADol (ULTRAM) 50 MG tablet Take 1 tablet (50 mg total) every 6 (six) hours as needed by mouth for moderate pain. 07/09/17   Sable Feil, PA-C    Allergies Patient has no known allergies.  Family History  Problem Relation Age of Onset  . Hypertension Mother   . Hypertension Father     Social History Social History   Tobacco Use  . Smoking status: Never Smoker  . Smokeless tobacco: Never Used  Substance Use Topics  . Alcohol use: No  . Drug use: No    Review of Systems Constitutional: No fever/chills. Morbid obesity Eyes: No visual changes. ENT: No sore throat. Cardiovascular: Denies chest pain. Respiratory: Denies shortness of breath. Gastrointestinal: No abdominal pain.  No nausea, no vomiting.  No diarrhea.  No constipation. Genitourinary: Negative for dysuria. Musculoskeletal: Right ankle pain Skin: Negative for rash. Neurological: Negative for headaches, focal weakness or numbness. Endocrine: Hypertension  ____________________________________________   PHYSICAL EXAM:  VITAL SIGNS: ED Triage Vitals  Enc Vitals  Group     BP 07/09/17 1028 (!) 152/92     Pulse Rate 07/09/17 1028 90     Resp 07/09/17 1028 20     Temp 07/09/17 1028 98.6 F (37 C)     Temp Source 07/09/17 1028 Oral     SpO2 07/09/17 1028 100 %     Weight 07/09/17 1024 290 lb (131.5 kg)     Height 07/09/17 1024 5\' 4"  (1.626 m)     Head Circumference --      Peak Flow --      Pain Score 07/09/17 1024 9     Pain Loc --      Pain Edu? --      Excl. in [place]? --      Constitutional: Alert and oriented. Well appearing and in no acute distress. Cardiovascular: Normal rate, regular rhythm. Grossly normal heart sounds.  Good peripheral circulation. Elevated blood pressure Respiratory: Normal respiratory effort.  No retractions. Lungs CTAB. Gastrointestinal: Soft and nontender. No distention. No abdominal bruits. No CVA tenderness. Musculoskeletal: Pes Planus.  Neurologic:  Normal speech and language. No gross focal neurologic deficits are appreciated. No gait instability. Skin:  Skin is warm, dry and intact. No rash noted. Psychiatric: Mood and affect are normal. Speech and behavior are normal.  ____________________________________________   LABS (all labs ordered are listed, but only abnormal results are displayed)  Labs Reviewed - No data to display ____________________________________________  EKG   ____________________________________________  RADIOLOGY  No results found. Reviewed x-ray from previous ER visit revealed patient with calcaneus and Achilles spurs.  ____________________________________________   PROCEDURES  Procedure(s) performed: None  Procedures  Critical Care performed: No  ____________________________________________   INITIAL IMPRESSION / ASSESSMENT AND PLAN / ED COURSE  As part of my medical decision making, I reviewed the following data within the electronic MEDICAL RECORD NUMBER    Right foot pain secondary to pes planus and heel spurs. Discussed x-ray findings with patient. Patient advised to follow-up denies for definitive evaluation and treatment. Patient advised to purchase over-the-counter heel spur supports until seen by podiatry.      ____________________________________________   FINAL CLINICAL IMPRESSION(S) / ED DIAGNOSES  Final diagnoses:  Heel spur, right  Pes planus of both feet     ED Discharge Orders        Ordered    naproxen (NAPROSYN) 500 MG tablet  2 times daily with meals      07/09/17 1053    traMADol (ULTRAM) 50 MG tablet  Every 6 hours PRN     07/09/17 1053       Note:  This document was prepared using Dragon voice recognition software and may include unintentional dictation errors.    Sable Feil, PA-C 07/09/17 1103    Earleen Newport, MD 07/09/17 [phone]

## 2017-07-09 NOTE — ED Notes (Signed)
Pt ambulatory upon discharge; refused wheel chair. Verbalized understanding of discharge instructions, importance of follow-up care, prescriptions and pain management. A&O x4. Skin warm and dry.

## 2017-07-09 NOTE — Discharge Instructions (Signed)
Purchase over-the-counter heel spur supports until evaluation by podiatry.

## 2017-07-09 NOTE — ED Triage Notes (Signed)
Pt slid and injured right foot, pt is able to ambulate

## 2017-07-23 ENCOUNTER — Other Ambulatory Visit: Payer: Self-pay

## 2017-07-23 ENCOUNTER — Encounter (HOSPITAL_COMMUNITY): Payer: Self-pay

## 2017-07-23 DIAGNOSIS — R110 Nausea: Secondary | ICD-10-CM | POA: Insufficient documentation

## 2017-07-23 DIAGNOSIS — Z5321 Procedure and treatment not carried out due to patient leaving prior to being seen by health care provider: Secondary | ICD-10-CM | POA: Insufficient documentation

## 2017-07-23 LAB — LIPASE, BLOOD: Lipase: 28 U/L (ref 11–51)

## 2017-07-23 LAB — COMPREHENSIVE METABOLIC PANEL
ALK PHOS: 58 U/L (ref 38–126)
ALT: 16 U/L (ref 14–54)
AST: 13 U/L — ABNORMAL LOW (ref 15–41)
Albumin: 3.3 g/dL — ABNORMAL LOW (ref 3.5–5.0)
Anion gap: 7 (ref 5–15)
BUN: 16 mg/dL (ref 6–20)
CALCIUM: 8.5 mg/dL — AB (ref 8.9–10.3)
CO2: 28 mmol/L (ref 22–32)
CREATININE: 0.94 mg/dL (ref 0.44–1.00)
Chloride: 103 mmol/L (ref 101–111)
Glucose, Bld: 100 mg/dL — ABNORMAL HIGH (ref 65–99)
Potassium: 3.8 mmol/L (ref 3.5–5.1)
SODIUM: 138 mmol/L (ref 135–145)
Total Bilirubin: 0.3 mg/dL (ref 0.3–1.2)
Total Protein: 7.5 g/dL (ref 6.5–8.1)

## 2017-07-23 LAB — CBC
HCT: 39.1 % (ref 36.0–46.0)
Hemoglobin: 12.5 g/dL (ref 12.0–15.0)
MCH: 26 pg (ref 26.0–34.0)
MCHC: 32 g/dL (ref 30.0–36.0)
MCV: 81.5 fL (ref 78.0–100.0)
PLATELETS: 290 10*3/uL (ref 150–400)
RBC: 4.8 MIL/uL (ref 3.87–5.11)
RDW: 13.9 % (ref 11.5–15.5)
WBC: 7.5 10*3/uL (ref 4.0–10.5)

## 2017-07-23 NOTE — ED Triage Notes (Signed)
Patient c/o intermittent right calf area and achilles pain x 1 month, but is more constant for the past few days. Patient also c/o sore throat and nausea since last night. Patient states she ate [place] food today and had nausea and vomiting afterwards.

## 2017-07-24 ENCOUNTER — Emergency Department (HOSPITAL_COMMUNITY)
Admission: EM | Admit: 2017-07-24 | Discharge: 2017-07-24 | Disposition: A | Discharge status: Home / Self Care | Payer: Self-pay | Attending: Emergency Medicine | Admitting: Emergency Medicine

## 2017-07-24 NOTE — ED Notes (Signed)
First attempt---No response. PT not in lobby

## 2017-07-24 NOTE — ED Notes (Signed)
Bed: WTR9 Expected date:  Expected time:  Means of arrival:  Comments: 

## 2017-08-29 ENCOUNTER — Ambulatory Visit: Payer: Self-pay | Admitting: Podiatry

## 2017-09-24 ENCOUNTER — Encounter: Payer: Self-pay | Admitting: Family Medicine

## 2017-09-24 ENCOUNTER — Ambulatory Visit (INDEPENDENT_AMBULATORY_CARE_PROVIDER_SITE_OTHER): Payer: Self-pay | Admitting: Family Medicine

## 2017-09-24 VITALS — BP 132/80 | HR 80 | Temp 98.2°F | Resp 14 | Ht 64.0 in | Wt 317.0 lb

## 2017-09-24 DIAGNOSIS — Z6841 Body Mass Index (BMI) 40.0 and over, adult: Secondary | ICD-10-CM

## 2017-09-24 DIAGNOSIS — I10 Essential (primary) hypertension: Secondary | ICD-10-CM

## 2017-09-24 DIAGNOSIS — R7303 Prediabetes: Secondary | ICD-10-CM

## 2017-09-24 DIAGNOSIS — Z131 Encounter for screening for diabetes mellitus: Secondary | ICD-10-CM

## 2017-09-24 LAB — POCT URINALYSIS DIP (DEVICE)
BILIRUBIN URINE: NEGATIVE
Glucose, UA: NEGATIVE mg/dL
Ketones, ur: NEGATIVE mg/dL
NITRITE: NEGATIVE
PH: 5.5 (ref 5.0–8.0)
PROTEIN: NEGATIVE mg/dL
Specific Gravity, Urine: 1.02 (ref 1.005–1.030)
Urobilinogen, UA: 0.2 mg/dL (ref 0.0–1.0)

## 2017-09-24 LAB — POCT GLYCOSYLATED HEMOGLOBIN (HGB A1C): Hemoglobin A1C: 5.9

## 2017-09-24 MED ORDER — METFORMIN HCL 500 MG PO TABS: 500.0000 mg | ORAL_TABLET | Freq: Every day | ORAL | 3 refills | Status: DC

## 2017-09-24 MED ORDER — LISINOPRIL 20 MG PO TABS: 20.0000 mg | ORAL_TABLET | Freq: Every day | ORAL | 1 refills | Status: DC

## 2017-09-24 NOTE — Progress Notes (Signed)
Patient ID: Chloe Castillo, female    DOB: 1981/02/22, 37 y.o.   MRN: [id number]  PCP: Scot Jun, FNP  Chief Complaint  Patient presents with  . Follow-up    6 MONTH ON HTN    Subjective:  HPI Chloe Castillo is a 37 y.o. female with a history of chronic headaches, hypertension, morbid obesity, presents for routine chronic disease management follow-up and evaluation of recent diagnosis of diabetes.Chloe Castillo is being followed at [facility] bariatric clinic and was recently advised that her hemoglobin A1c was 6.5 less than a month ago. Point of care A1c was repeated here in office today and A1c was 5.9.  She reports today that she is in the process of finalizing bariatric surgery.  She has a goal weight loss of 183 pounds.  She has attempted to lose weight naturally through a weight loss program prescribed by the bariatric clinic, however only achieved minimal success. She is currently not engaging in routine physical activity. Current Body mass index is 54.41 kg/m. Chloe Castillo also has suffered from persistent headaches and was prescribed sumatriptan during her last office visit. Reports today, headaches have resolved. Reports adherence with current medication regimen. Denies shortness of breath, chest pain, BLE swelling, or new weakness.  Social History   Socioeconomic History  . Marital status: Single    Spouse name: Not on file  . Number of children: Not on file  . Years of education: Not on file  . Highest education level: Not on file  Social Needs  . Financial resource strain: Not on file  . Food insecurity - worry: Not on file  . Food insecurity - inability: Not on file  . Transportation needs - medical: Not on file  . Transportation needs - non-medical: Not on file  Occupational History  . Not on file  Tobacco Use  . Smoking status: Never Smoker  . Smokeless tobacco: Never Used  Substance and Sexual Activity  . Alcohol use: No  . Drug use: No  . Sexual activity: No  Other  Topics Concern  . Not on file  Social History Narrative  . Not on file    Family History  Problem Relation Age of Onset  . Hypertension Mother   . Hypertension Father    Review of Systems Constitutional: Negative for fever, chills, diaphoresis, activity change, appetite change and fatigue. Eyes: Negative for pain, discharge, redness, itching and visual disturbance. Respiratory: Negative for cough, choking, chest tightness, shortness of breath, wheezing and stridor.  Cardiovascular: Negative for chest pain, palpitations and leg swelling. Gastrointestinal: Negative for abdominal distention.  Musculoskeletal: Negative for back pain, joint swelling, arthralgia and gait problem. Neurological: Negative for dizziness, tremors, seizures, syncope, facial asymmetry, speech difficulty, weakness, light-headedness, numbness and headaches.  Hematological: Negative for adenopathy. Does not bruise/bleed easily. Psychiatric/Behavioral: Negative for hallucinations, behavioral problems, confusion, dysphoric mood, decreased concentration and agitation.   Patient Active Problem List   Diagnosis Date Noted  . Elevated blood pressure reading without diagnosis of hypertension 12/24/2016  . Hx of migraine headaches 12/24/2016  . Class 3 obesity with body mass index (BMI) of 45.0 to 49.9 in adult 12/24/2016    No Known Allergies  Prior to Admission medications   Medication Sig Start Date End Date Taking? Authorizing Provider  acetaminophen (TYLENOL) 500 MG tablet Take 1,000 mg by mouth every 6 (six) hours as needed (headache).   Yes [provider]  cyclobenzaprine (FLEXERIL) 10 MG tablet Take 1 tablet (10 mg total) by mouth 3 (  three) times daily as needed for muscle spasms. 01/31/17  Yes Street, [employer], PA-C  levonorgestrel (MIRENA, 52 MG,) 20 MCG/24HR IUD 1 each by Intrauterine route once.   Yes [provider]  lisinopril (PRINIVIL,ZESTRIL) 20 MG tablet Take 1 tablet (20 mg total) by  mouth daily. 03/22/17  Yes Scot Jun, FNP  naproxen (NAPROSYN) 500 MG tablet Take 1 tablet (500 mg total) 2 (two) times daily with a meal by mouth. 07/09/17  Yes Sable Feil, PA-C  SUMAtriptan (IMITREX) 100 MG tablet Take 1 tablet (100 mg total) by mouth every 2 (two) hours as needed for migraine. Do not exceed more than 200 mg in 24 hours 12/19/16  Yes Scot Jun, FNP  naproxen (NAPROSYN) 500 MG tablet Take 1 tablet (500 mg total) by mouth 2 (two) times daily as needed for mild pain, moderate pain or headache (TAKE WITH MEALS.). Patient not taking: Reported on 09/24/2017 01/31/17   Street, [place], PA-C  traMADol (ULTRAM) 50 MG tablet Take 1 tablet (50 mg total) every 6 (six) hours as needed by mouth for moderate pain. Patient not taking: Reported on 09/24/2017 07/09/17   Hewitt Blade    Past Medical, Surgical Family and Social History reviewed and updated.    Objective:   Today's Vitals   09/24/17 1149  BP: 132/80  Pulse: 80  Resp: 14  Temp: 98.2 F (36.8 C)  TempSrc: Oral  SpO2: 98%  Weight: (!) 317 lb (143.8 kg)  Height: 5\' 4"  (1.626 m)    Wt Readings from Last 3 Encounters:  09/24/17 (!) 317 lb (143.8 kg)  07/23/17 290 lb (131.5 kg)  07/09/17 290 lb (131.5 kg)    Physical Exam  Constitutional: She is oriented to person, place, and time. She appears well-developed and well-nourished.  HENT:  Head: Normocephalic and atraumatic.  Eyes: Conjunctivae and EOM are normal. Pupils are equal, round, and reactive to light.  Neck: Normal range of motion. Neck supple. No thyromegaly present.  Cardiovascular: Normal rate, regular rhythm, normal heart sounds and intact distal pulses.  Pulmonary/Chest: Effort normal and breath sounds normal.  Musculoskeletal: Normal range of motion.  Neurological: She is alert and oriented to person, place, and time.  Skin: Skin is warm and dry.  Psychiatric: She has a normal mood and affect. Her behavior is normal. Judgment and  thought content normal.   Assessment & Plan:  1. Prediabetes, point-of-care A1c today is 5.9, however patient had an A1c send out completed at [facility] 3 weeks ago indicated she had diabetes with an A1c of 6.5.  I will obtain a send out hemoglobin A1c however we will go ahead and start patient on metformin 500 mg once daily with meal.  Encourage physical activity with a goal of 150 minutes/week.  Recommended vigorous walking initially.  Encourage efforts to reduce weight as this would likely have the greatest impact to glycemic control.   2. Essential hypertension, well controlled today. We have discussed target BP range and blood pressure goal. I have advised patient to check BP regularly and to call us back or report to clinic if the numbers are consistently higher than 140/90. We discussed the importance of compliance with medical therapy and DASH diet recommended, consequences of uncontrolled hypertension discussed.  - Continue  lisinopril (PRINIVIL,ZESTRIL) 20 MG tablet  3. Body mass index (BMI) of 50-59.9 in adult [facility]), Currently followed by [facility].  Patient is considering bariatric surgery and is currently in the process  of being approved for surgery.   Meds ordered this encounter  Medications  . lisinopril (PRINIVIL,ZESTRIL) 20 MG tablet    Sig: Take 1 tablet (20 mg total) by mouth daily.    Dispense:  90 tablet    Refill:  1    Order Specific Question:   Supervising Provider    Answer:   Tresa Garter [id number]  . metFORMIN (GLUCOPHAGE) 500 MG tablet    Sig: Take 1 tablet (500 mg total) by mouth daily with supper.    Dispense:  180 tablet    Refill:  3    Order Specific Question:   Supervising Provider    Answer:   Tresa Garter [id number]    Orders Placed This Encounter  Procedures  . Hemoglobin A1c  . POCT glycosylated hemoglobin (Hb A1C)  . POCT urinalysis dip (device)    Return for care in 6 months for Hypertension and Diabetes  follow-up.     Carroll Sage. Kenton Kingfisher, MSN, FNP-C [facility]  [address] [place], [place] [address] [phone]

## 2017-09-25 ENCOUNTER — Telehealth: Payer: Self-pay | Admitting: Family Medicine

## 2017-09-25 LAB — HEMOGLOBIN A1C
Est. average glucose Bld gHb Est-mCnc: 131 mg/dL
HEMOGLOBIN A1C: 6.2 % — AB (ref 4.8–5.6)

## 2017-09-25 NOTE — Telephone Encounter (Signed)
Contact patient to advise hemoglobin a1C is 6.2 and is consistent with prediabetes. I recommend that she start metformin 500 g once daily. Medication was sent to pharmacy on yesterday.  Carroll Sage. Kenton Kingfisher, MSN, FNP-C [facility]  [address] [place], [place] [address] [phone]

## 2017-12-01 ENCOUNTER — Emergency Department (HOSPITAL_COMMUNITY)
Admission: EM | Admit: 2017-12-01 | Discharge: 2017-12-01 | Disposition: A | Discharge status: Home / Self Care | Payer: Medicaid Other | Attending: Emergency Medicine | Admitting: Emergency Medicine

## 2017-12-01 ENCOUNTER — Encounter (HOSPITAL_COMMUNITY): Payer: Self-pay | Admitting: Emergency Medicine

## 2017-12-01 DIAGNOSIS — Z79899 Other long term (current) drug therapy: Secondary | ICD-10-CM | POA: Insufficient documentation

## 2017-12-01 DIAGNOSIS — J069 Acute upper respiratory infection, unspecified: Secondary | ICD-10-CM

## 2017-12-01 DIAGNOSIS — B9789 Other viral agents as the cause of diseases classified elsewhere: Secondary | ICD-10-CM | POA: Insufficient documentation

## 2017-12-01 DIAGNOSIS — Z7984 Long term (current) use of oral hypoglycemic drugs: Secondary | ICD-10-CM | POA: Insufficient documentation

## 2017-12-01 DIAGNOSIS — H9203 Otalgia, bilateral: Secondary | ICD-10-CM | POA: Insufficient documentation

## 2017-12-01 DIAGNOSIS — R05 Cough: Secondary | ICD-10-CM | POA: Insufficient documentation

## 2017-12-01 MED ORDER — FLUTICASONE PROPIONATE 50 MCG/ACT NA SUSP: 1.0000 | Freq: Every day | NASAL | 0 refills | Status: DC

## 2017-12-01 MED ORDER — GUAIFENESIN ER 600 MG PO TB12: 600.0000 mg | ORAL_TABLET | Freq: Two times a day (BID) | ORAL | 0 refills | Status: DC | PRN

## 2017-12-01 NOTE — ED Provider Notes (Signed)
[facility] DEPT Provider Note   CSN: [id number] Arrival date & time: 12/01/17  2031     History   Chief Complaint Chief Complaint  Patient presents with  . Nasal Congestion  . Otalgia    HPI Eathel Pajak is a 37 y.o. female.  The history is provided by the patient. No language interpreter was used.  Otalgia    Shekinah Pitones is a 37 y.o. female who presents to the Emergency Department complaining of nasal congestion, ear ache. Yesterday she began feeling poorly with nasal congestion, runny nose, bilateral earache, sore throat, cough. Her cough is dry. She reports multiple sick contacts with similar symptoms. No fevers. She feels like she cannot breathe because her nose is congested. She denies any abdominal pain, vomiting, diarrhea. She does have some associated nausea. Symptoms are moderate and constant in nature. She has taken DayQuil and NyQuil with no significant improvement in her symptoms. She has a history of hypertension. Past Medical History:  Diagnosis Date  . Hypertension     Patient Active Problem List   Diagnosis Date Noted  . Elevated blood pressure reading without diagnosis of hypertension 12/24/2016  . Hx of migraine headaches 12/24/2016  . Class 3 obesity with body mass index (BMI) of 45.0 to 49.9 in adult 12/24/2016    History reviewed. No pertinent surgical history.   OB History   None      Home Medications    Prior to Admission medications   Medication Sig Start Date End Date Taking? Authorizing Provider  acetaminophen (TYLENOL) 500 MG tablet Take 1,000 mg by mouth every 6 (six) hours as needed (headache).    [provider]  cyclobenzaprine (FLEXERIL) 10 MG tablet Take 1 tablet (10 mg total) by mouth 3 (three) times daily as needed for muscle spasms. 01/31/17   Street, Mercedes, PA-C  fluticasone (FLONASE) 50 MCG/ACT nasal spray Place 1 spray into both nostrils daily. 12/01/17   Quintella Reichert, MD  guaiFENesin  (MUCINEX) 600 MG 12 hr tablet Take 1 tablet (600 mg total) by mouth 2 (two) times daily as needed. 12/01/17   Quintella Reichert, MD  levonorgestrel (MIRENA, 52 MG,) 20 MCG/24HR IUD 1 each by Intrauterine route once.    [provider]  lisinopril (PRINIVIL,ZESTRIL) 20 MG tablet Take 1 tablet (20 mg total) by mouth daily. 09/24/17   Scot Jun, FNP  metFORMIN (GLUCOPHAGE) 500 MG tablet Take 1 tablet (500 mg total) by mouth daily with supper. 09/24/17   Scot Jun, FNP  naproxen (NAPROSYN) 500 MG tablet Take 1 tablet (500 mg total) by mouth 2 (two) times daily as needed for mild pain, moderate pain or headache (TAKE WITH MEALS.). Patient not taking: Reported on 09/24/2017 01/31/17   Street, [place], PA-C  naproxen (NAPROSYN) 500 MG tablet Take 1 tablet (500 mg total) 2 (two) times daily with a meal by mouth. 07/09/17   Sable Feil, PA-C  SUMAtriptan (IMITREX) 100 MG tablet Take 1 tablet (100 mg total) by mouth every 2 (two) hours as needed for migraine. Do not exceed more than 200 mg in 24 hours 12/19/16   Scot Jun, FNP  traMADol (ULTRAM) 50 MG tablet Take 1 tablet (50 mg total) every 6 (six) hours as needed by mouth for moderate pain. Patient not taking: Reported on 09/24/2017 07/09/17   Sable Feil, PA-C    Family History Family History  Problem Relation Age of Onset  . Hypertension Mother   . Hypertension Father  Social History Social History   Tobacco Use  . Smoking status: Never Smoker  . Smokeless tobacco: Never Used  Substance Use Topics  . Alcohol use: No  . Drug use: No     Allergies   Patient has no known allergies.   Review of Systems Review of Systems  HENT: Positive for ear pain.   All other systems reviewed and are negative.    Physical Exam Updated Vital Signs BP (!) 156/100 (BP Location: Left Arm)   Pulse 71   Temp 98.1 F (36.7 C) (Oral)   Resp 17   LMP 11/19/2017   SpO2 99%   Physical Exam  Constitutional: She is  oriented to person, place, and time. She appears well-developed and well-nourished.  HENT:  Head: Normocephalic and atraumatic.  TMs clear bilaterally. Boggy and edematous nasal mucosa bilaterally. Patent nares bilaterally. Oropharynx without any erythema or edema. No Strider.  Cardiovascular: Normal rate and regular rhythm.  No murmur heard. Pulmonary/Chest: Effort normal and breath sounds normal. No respiratory distress.  Abdominal: Soft. There is no tenderness. There is no rebound and no guarding.  Musculoskeletal: She exhibits no edema or tenderness.  Neurological: She is alert and oriented to person, place, and time.  Skin: Skin is warm and dry.  Psychiatric: She has a normal mood and affect. Her behavior is normal.  Nursing note and vitals reviewed.    ED Treatments / Results  Labs (all labs ordered are listed, but only abnormal results are displayed) Labs Reviewed - No data to display  EKG None  Radiology No results found.  Procedures Procedures (including critical care time)  Medications Ordered in ED Medications - No data to display   Initial Impression / Assessment and Plan / ED Course  I have reviewed the triage vital signs and the nursing notes.  Pertinent labs & imaging results that were available during my care of the patient were reviewed by me and considered in my medical decision making (see chart for details).     Patient here for evaluation of nasal congestion, ear aches, sore throat. She is non-toxic appearing on examination with no acute distress. No clinical evidence of otitis media, airway obstruction, strep pharyngitis, pneumonia. Discussed with patient home care for viral URI. Discussed use of decongestants as well as Afrin, available over-the-counter. Discussed outpatient follow-up and return precautions.  Final Clinical Impressions(s) / ED Diagnoses   Final diagnoses:  Viral URI    ED Discharge Orders        Ordered    guaiFENesin  (MUCINEX) 600 MG 12 hr tablet  2 times daily PRN     12/01/17 2142    fluticasone (FLONASE) 50 MCG/ACT nasal spray  Daily     12/01/17 2142       Quintella Reichert, MD 12/01/17 2148

## 2017-12-01 NOTE — Discharge Instructions (Addendum)
You can use afrin nasal spray, available over the counter - use according to label instructions.  Do not use for more than three days.

## 2017-12-01 NOTE — ED Triage Notes (Signed)
Patient presents ambulatory c/o congestion, cough, sore throat and bilateral ear aches onset of yesterday.

## 2017-12-02 ENCOUNTER — Emergency Department (HOSPITAL_COMMUNITY)
Admission: EM | Admit: 2017-12-02 | Discharge: 2017-12-02 | Disposition: A | Discharge status: Home / Self Care | Payer: Medicaid Other | Attending: Emergency Medicine | Admitting: Emergency Medicine

## 2017-12-02 ENCOUNTER — Encounter (HOSPITAL_COMMUNITY): Payer: Self-pay | Admitting: Emergency Medicine

## 2017-12-02 DIAGNOSIS — Z79899 Other long term (current) drug therapy: Secondary | ICD-10-CM | POA: Insufficient documentation

## 2017-12-02 DIAGNOSIS — Z7984 Long term (current) use of oral hypoglycemic drugs: Secondary | ICD-10-CM | POA: Insufficient documentation

## 2017-12-02 DIAGNOSIS — B9789 Other viral agents as the cause of diseases classified elsewhere: Secondary | ICD-10-CM | POA: Insufficient documentation

## 2017-12-02 DIAGNOSIS — I10 Essential (primary) hypertension: Secondary | ICD-10-CM | POA: Insufficient documentation

## 2017-12-02 DIAGNOSIS — J069 Acute upper respiratory infection, unspecified: Secondary | ICD-10-CM

## 2017-12-02 MED ORDER — OXYMETAZOLINE HCL 0.05 % NA SOLN: 1.0000 | Freq: Two times a day (BID) | NASAL | 0 refills | Status: AC

## 2017-12-02 MED ORDER — PHENOL 1.4 % MT LIQD: 1.0000 | OROMUCOSAL | 0 refills | Status: DC | PRN

## 2017-12-02 NOTE — ED Triage Notes (Signed)
Reports that she was seen here yesterday and given medications but still not felling any better. Congestion, sore throat, headache.

## 2017-12-02 NOTE — Discharge Instructions (Signed)
Use Tylenol or ibuprofen as needed for fevers or body aches. Use Flonase and afrin daily for nasal congestion and cough. Stop using afrin after 3 days, as this can cause worsening symptoms.  Use the sore throat spray as needed.  Make sure you stay well-hydrated with water. Wash your hands frequently to prevent spread of infection. Follow-up with your primary care doctor in 1 week if your symptoms are not improving. Return to the emergency room if you develop chest pain, difficulty breathing, or any new or worsening symptoms.

## 2017-12-02 NOTE — ED Provider Notes (Signed)
[facility] DEPT Provider Note   CSN: [id number] Arrival date & time: 12/02/17  1323     History   Chief Complaint Chief Complaint  Patient presents with  . Nasal Congestion  . Sore Throat    HPI Chloe Castillo is a 37 y.o. female presenting for evaluation of URI sxs.   Patient states that her symptoms began on Friday.  She reports a sore throat, bilateral ear pain, nasal congestion, and a nonproductive cough.  She has sinus pain and pressure.  She was evaluated in the ER last night, discharged with Flonase and did states that this is not helped.  She has not tried anything else including Afrin or ibuprofen.  She reports some mild nausea, but states that she has not had anything to eat today.  No vomiting.  She denies fevers, chills, eye irritation, shortness of breath, chest pain, abdominal pain, urinary symptoms, abnormal bowel movements. She has multiple family members at home with the same symptoms.  She does not smoke cigarettes.  No history of asthma or COPD.  HPI  Past Medical History:  Diagnosis Date  . Hypertension     Patient Active Problem List   Diagnosis Date Noted  . Elevated blood pressure reading without diagnosis of hypertension 12/24/2016  . Hx of migraine headaches 12/24/2016  . Class 3 obesity with body mass index (BMI) of 45.0 to 49.9 in adult 12/24/2016    History reviewed. No pertinent surgical history.   OB History   None      Home Medications    Prior to Admission medications   Medication Sig Start Date End Date Taking? Authorizing Provider  acetaminophen (TYLENOL) 500 MG tablet Take 1,000 mg by mouth every 6 (six) hours as needed (headache).    [provider]  cyclobenzaprine (FLEXERIL) 10 MG tablet Take 1 tablet (10 mg total) by mouth 3 (three) times daily as needed for muscle spasms. 01/31/17   Street, Mercedes, PA-C  fluticasone (FLONASE) 50 MCG/ACT nasal spray Place 1 spray into both nostrils daily.  12/01/17   Quintella Reichert, MD  guaiFENesin (MUCINEX) 600 MG 12 hr tablet Take 1 tablet (600 mg total) by mouth 2 (two) times daily as needed. 12/01/17   Quintella Reichert, MD  levonorgestrel (MIRENA, 52 MG,) 20 MCG/24HR IUD 1 each by Intrauterine route once.    [provider]  lisinopril (PRINIVIL,ZESTRIL) 20 MG tablet Take 1 tablet (20 mg total) by mouth daily. 09/24/17   Scot Jun, FNP  metFORMIN (GLUCOPHAGE) 500 MG tablet Take 1 tablet (500 mg total) by mouth daily with supper. 09/24/17   Scot Jun, FNP  naproxen (NAPROSYN) 500 MG tablet Take 1 tablet (500 mg total) by mouth 2 (two) times daily as needed for mild pain, moderate pain or headache (TAKE WITH MEALS.). Patient not taking: Reported on 09/24/2017 01/31/17   Street, [place], PA-C  naproxen (NAPROSYN) 500 MG tablet Take 1 tablet (500 mg total) 2 (two) times daily with a meal by mouth. 07/09/17   Sable Feil, PA-C  oxymetazoline (AFRIN NASAL SPRAY) 0.05 % nasal spray Place 1 spray into both nostrils 2 (two) times daily for 3 days. 12/02/17 12/05/17  Rosalie Buenaventura, PA-C  phenol (CHLORASEPTIC) 1.4 % LIQD Use as directed 1 spray in the mouth or throat as needed for throat irritation / pain. 12/02/17   Corneisha Alvi, PA-C  SUMAtriptan (IMITREX) 100 MG tablet Take 1 tablet (100 mg total) by mouth every 2 (two) hours as needed  for migraine. Do not exceed more than 200 mg in 24 hours 12/19/16   Scot Jun, FNP  traMADol (ULTRAM) 50 MG tablet Take 1 tablet (50 mg total) every 6 (six) hours as needed by mouth for moderate pain. Patient not taking: Reported on 09/24/2017 07/09/17   Sable Feil, PA-C    Family History Family History  Problem Relation Age of Onset  . Hypertension Mother   . Hypertension Father     Social History Social History   Tobacco Use  . Smoking status: Never Smoker  . Smokeless tobacco: Never Used  Substance Use Topics  . Alcohol use: No  . Drug use: No     Allergies     Patient has no known allergies.   Review of Systems Review of Systems  Constitutional: Negative for chills and fever.  HENT: Positive for congestion, ear pain, rhinorrhea, sinus pressure, sinus pain and sore throat.      Physical Exam Updated Vital Signs BP (!) 151/116 (BP Location: Left Arm)   Pulse 80   Temp 98.5 F (36.9 C) (Oral)   Resp 20   Ht 5\' 5"  (1.651 m)   Wt (!) 149.8 kg (330 lb 5 oz)   LMP 11/19/2017   SpO2 100%   BMI 54.97 kg/m   Physical Exam  Constitutional: She is oriented to person, place, and time. She appears well-developed and well-nourished. No distress.  HENT:  Head: Normocephalic and atraumatic.  Right Ear: Tympanic membrane, external ear and ear canal normal.  Left Ear: Tympanic membrane, external ear and ear canal normal.  Nose: Mucosal edema present. Right sinus exhibits maxillary sinus tenderness. Left sinus exhibits maxillary sinus tenderness.  Mouth/Throat: Uvula is midline, oropharynx is clear and moist and mucous membranes are normal. No tonsillar exudate.  OP clear without tonsillar swelling or exudate.  Uvula midline with equal palate rise.  TMs nonerythematous and not bulging bilaterally.  Nasal mucosal edema.  Tenderness palpation of maxillary sinuses.  Eyes: Pupils are equal, round, and reactive to light. Conjunctivae and EOM are normal.  Neck: Normal range of motion.  Cardiovascular: Normal rate, regular rhythm and intact distal pulses.  Pulmonary/Chest: Effort normal and breath sounds normal. No respiratory distress. She has no decreased breath sounds. She has no wheezes. She has no rhonchi. She has no rales.  Pt speaking in full sentences.  Clear lung sounds in all fields  Abdominal: Soft. She exhibits no distension. There is no tenderness.  Musculoskeletal: Normal range of motion.  Lymphadenopathy:    She has no cervical adenopathy.  Neurological: She is alert and oriented to person, place, and time.  Skin: Skin is warm.   Psychiatric: She has a normal mood and affect.  Nursing note and vitals reviewed.    ED Treatments / Results  Labs (all labs ordered are listed, but only abnormal results are displayed) Labs Reviewed - No data to display  EKG None  Radiology No results found.  Procedures Procedures (including critical care time)  Medications Ordered in ED Medications - No data to display   Initial Impression / Assessment and Plan / ED Course  I have reviewed the triage vital signs and the nursing notes.  Pertinent labs & imaging results that were available during my care of the patient were reviewed by me and considered in my medical decision making (see chart for details).     Patient presenting with 3 day h/o URI symptoms.  Physical exam reassuring, patient is afebrile and appears nontoxic.  Pulmonary exam reassuring.  Doubt pneumonia, strep, other bacterial infection, or peritonsillar abscess.  Likely viral URI.  Will treat symptomatically.  Discussed at length the course of a virus and expectations of symptom management.  Patient to follow-up with primary care as needed.  At this time, patient appears safe for discharge.  Return precautions given.  Patient states she understands and agrees to plan.    Final Clinical Impressions(s) / ED Diagnoses   Final diagnoses:  Viral URI with cough    ED Discharge Orders        Ordered    oxymetazoline (AFRIN NASAL SPRAY) 0.05 % nasal spray  2 times daily     12/02/17 1649    phenol (CHLORASEPTIC) 1.4 % LIQD  As needed     12/02/17 1649       Reg Bircher, PA-C 12/02/17 1714    Lacretia Leigh, MD 12/02/17 2319

## 2018-03-26 ENCOUNTER — Ambulatory Visit (INDEPENDENT_AMBULATORY_CARE_PROVIDER_SITE_OTHER): Payer: Self-pay | Admitting: Family Medicine

## 2018-03-26 ENCOUNTER — Encounter: Payer: Self-pay | Admitting: Family Medicine

## 2018-03-26 VITALS — BP 140/88 | HR 88 | Temp 98.0°F | Ht 65.0 in | Wt 334.0 lb

## 2018-03-26 DIAGNOSIS — Z09 Encounter for follow-up examination after completed treatment for conditions other than malignant neoplasm: Secondary | ICD-10-CM

## 2018-03-26 DIAGNOSIS — Z8669 Personal history of other diseases of the nervous system and sense organs: Secondary | ICD-10-CM

## 2018-03-26 DIAGNOSIS — R7303 Prediabetes: Secondary | ICD-10-CM

## 2018-03-26 DIAGNOSIS — I10 Essential (primary) hypertension: Secondary | ICD-10-CM

## 2018-03-26 DIAGNOSIS — Z6841 Body Mass Index (BMI) 40.0 and over, adult: Secondary | ICD-10-CM

## 2018-03-26 DIAGNOSIS — N390 Urinary tract infection, site not specified: Secondary | ICD-10-CM

## 2018-03-26 DIAGNOSIS — R8290 Unspecified abnormal findings in urine: Secondary | ICD-10-CM

## 2018-03-26 DIAGNOSIS — M79671 Pain in right foot: Secondary | ICD-10-CM

## 2018-03-26 LAB — POCT URINALYSIS DIP (MANUAL ENTRY)
Bilirubin, UA: NEGATIVE
Blood, UA: NEGATIVE
Glucose, UA: NEGATIVE mg/dL
Ketones, POC UA: NEGATIVE mg/dL
Nitrite, UA: NEGATIVE
Protein Ur, POC: NEGATIVE mg/dL
Spec Grav, UA: 1.02 (ref 1.010–1.025)
Urobilinogen, UA: 0.2 E.U./dL
pH, UA: 6 (ref 5.0–8.0)

## 2018-03-26 LAB — POCT GLYCOSYLATED HEMOGLOBIN (HGB A1C): Hemoglobin A1C: 5.6 % (ref 4.0–5.6)

## 2018-03-26 MED ORDER — SULFAMETHOXAZOLE-TRIMETHOPRIM 800-160 MG PO TABS: 1.0000 | ORAL_TABLET | Freq: Two times a day (BID) | ORAL | 0 refills | Status: DC

## 2018-03-26 MED ORDER — METFORMIN HCL 500 MG PO TABS: 500.0000 mg | ORAL_TABLET | Freq: Every day | ORAL | 2 refills | Status: DC

## 2018-03-26 NOTE — Progress Notes (Signed)
Follow Up  Subjective:    Patient ID: Chloe Castillo, female    DOB: March 23, 1981, 37 y.o.   MRN: [id number]  No chief complaint on file.  HPI  Chloe Castillo has a past medical history of Hypertension. She is here today for follow up.   Current Status: Since her last office visit, she is doing well with no complaints. She denies fevers, chills, fatigue, recent infections, weight loss, and night sweats. She has not had any headaches, visual changes, dizziness, and falls. No chest pain, heart palpitations, cough and shortness of breath reported. No reports of GI problems such as nausea, vomiting, diarrhea, and constipation. She has no reports of blood in stools, dysuria and hematuria. No depression or anxiety, and denies suicidal ideations, homicidal ideations, or auditory hallucinations. She denies pain today.   Past Medical History:  Diagnosis Date  . Hypertension     Family History  Problem Relation Age of Onset  . Hypertension Mother   . Hypertension Father     Social History   Socioeconomic History  . Marital status: Single    Spouse name: Not on file  . Number of children: Not on file  . Years of education: Not on file  . Highest education level: Not on file  Occupational History  . Not on file  Social Needs  . Financial resource strain: Not on file  . Food insecurity:    Worry: Not on file    Inability: Not on file  . Transportation needs:    Medical: Not on file    Non-medical: Not on file  Tobacco Use  . Smoking status: Never Smoker  . Smokeless tobacco: Never Used  Substance and Sexual Activity  . Alcohol use: No  . Drug use: No  . Sexual activity: Never  Lifestyle  . Physical activity:    Days per week: Not on file    Minutes per session: Not on file  . Stress: Not on file  Relationships  . Social connections:    Talks on phone: Not on file    Gets together: Not on file    Attends religious service: Not on file    Active member of club or organization: Not  on file    Attends meetings of clubs or organizations: Not on file    Relationship status: Not on file  . Intimate partner violence:    Fear of current or ex partner: Not on file    Emotionally abused: Not on file    Physically abused: Not on file    Forced sexual activity: Not on file  Other Topics Concern  . Not on file  Social History Narrative  . Not on file    No past surgical history on file.   Immunization History  Administered Date(s) Administered  . Tdap 12/19/2016    Current Meds  Medication Sig  . SUMAtriptan (IMITREX) 100 MG tablet Take 1 tablet (100 mg total) by mouth every 2 (two) hours as needed for migraine. Do not exceed more than 200 mg in 24 hours    No Known Allergies  BP 140/88   Pulse 88   Temp 98 F (36.7 C) (Oral)   Ht 5\' 5"  (1.651 m)   Wt (!) 334 lb (151.5 kg)   LMP 03/15/2018   SpO2 100%   BMI 55.58 kg/m    Review of Systems  Constitutional: Negative.   HENT: Negative.   Eyes: Negative.   Respiratory: Negative.   Cardiovascular: Negative.  Gastrointestinal: Negative.   Endocrine: Negative.   Genitourinary: Negative.   Musculoskeletal: Negative.   Skin: Negative.   Allergic/Immunologic: Negative.   Neurological: Negative.   Hematological: Negative.   Psychiatric/Behavioral: Negative.    Objective:   Physical Exam  Constitutional: She is oriented to person, place, and time. She appears well-developed and well-nourished.  HENT:  Head: Normocephalic and atraumatic.  Right Ear: External ear normal.  Left Ear: External ear normal.  Nose: Nose normal.  Mouth/Throat: Oropharynx is clear and moist.  Eyes: Pupils are equal, round, and reactive to light. Conjunctivae and EOM are normal.  Neck: Normal range of motion. Neck supple.  Cardiovascular: Normal rate, regular rhythm, normal heart sounds and intact distal pulses.  Pulmonary/Chest: Effort normal and breath sounds normal.  Abdominal: Soft. Bowel sounds are normal.   Musculoskeletal: Normal range of motion.  Neurological: She is alert and oriented to person, place, and time.  Skin: Skin is warm and dry. Capillary refill takes less than 2 seconds.  Psychiatric: She has a normal mood and affect. Her behavior is normal. Judgment and thought content normal.  Nursing note and vitals reviewed.  Assessment & Plan:   1. BMI 50.0-59.9, adult [facility]) She is doing well with lab work. She requests additional help with weight loss. We will refer her to Bariatric Surgery today. Goal BMI  is <25. Encouraged efforts to reduce weight include engaging in physical activity as tolerated with goal of 150 minutes per week. Improve dietary choices and eat a meal regimen consistent with a Mediterranean or DASH diet. Reduce simple carbohydrates. Do not skip meals and eat healthy snacks throughout the day to avoid over-eating at dinner. Set a goal weight loss that is achievable for you.  - Amb Referral to Bariatric Surgery - Referral to Nutrition and Diabetes Services  2. Essential hypertension Blood pressure is 140/88 today. Continue Lisinopril as prescribed. pressure is  - Referral to Nutrition and Diabetes Services  3. Hx of migraine headaches Stable today. Continue Imitrex as needed.   4. Foot pain, right Acetaminophen for pain.   5. Urinary tract infection without hematuria, site unspecified - sulfamethoxazole-trimethoprim (BACTRIM DS,SEPTRA DS) 800-160 MG tablet; Take 1 tablet by mouth 2 (two) times daily.  Dispense: 14 tablet; Refill: 0  6. Abnormal urinalysis - Urine Culture  7. Prediabetes Improving. Hgb A1c is in normal range of 5.6 today, from 6.2 on 09/24/2017. He will continue Metformin as prescribed.  She will continue to decrease foods/beverages high in sugars and carbs and follow Heart Healthy or DASH diet. Increase physical activity to at least 30 minutes cardio exercise daily.   - POCT glycosylated hemoglobin (Hb A1C) - POCT urinalysis dipstick -  Referral to Nutrition and Diabetes Services  8. Follow up She will follow up in 2 months.  Meds ordered this encounter  Medications  . sulfamethoxazole-trimethoprim (BACTRIM DS,SEPTRA DS) 800-160 MG tablet    Sig: Take 1 tablet by mouth 2 (two) times daily.    Dispense:  14 tablet    Refill:  0  . metFORMIN (GLUCOPHAGE) 500 MG tablet    Sig: Take 1 tablet (500 mg total) by mouth daily with supper.    Dispense:  180 tablet    Refill:  2    Referral Orders     Amb Referral to Bariatric Surgery     Referral to Nutrition and Diabetes Services   [name],  MSN, FNP-C Patient [place], [place]  [phone]  

## 2018-03-28 LAB — URINE CULTURE

## 2018-03-31 ENCOUNTER — Other Ambulatory Visit: Payer: Self-pay | Admitting: Family Medicine

## 2018-03-31 DIAGNOSIS — A498 Other bacterial infections of unspecified site: Secondary | ICD-10-CM

## 2018-03-31 MED ORDER — CEPHALEXIN 500 MG PO CAPS: 500.0000 mg | ORAL_CAPSULE | Freq: Two times a day (BID) | ORAL | 0 refills | Status: AC

## 2018-03-31 NOTE — Progress Notes (Signed)
Rx for Keflex to pharmacy today.

## 2018-04-01 ENCOUNTER — Other Ambulatory Visit: Payer: Self-pay

## 2018-04-01 NOTE — Telephone Encounter (Signed)
-----   Message from Azzie Glatter, FNP sent at 03/31/2018  7:59 PM EDT ----- Regarding: "Lab Results" Chloe Castillo,   Urine culture identified bacteria. Please inform patient that we have sent new Rx for Keflex to pharmacy today. This antibiotic is specific to eliminate this bacteria. She is to discontinue Septra. She is to take medication as directed. She is to complete all medication until complete.   Thank you.

## 2018-04-01 NOTE — Telephone Encounter (Signed)
Left a vm for patient to callback 

## 2018-04-02 NOTE — Telephone Encounter (Signed)
-----   Message from Azzie Glatter, FNP sent at 03/31/2018  7:59 PM EDT ----- Regarding: "Lab Results" Carrie,   Urine culture identified bacteria. Please inform patient that we have sent new Rx for Keflex to pharmacy today. This antibiotic is specific to eliminate this bacteria. She is to discontinue Septra. She is to take medication as directed. She is to complete all medication until complete.   Thank you.

## 2018-04-02 NOTE — Telephone Encounter (Signed)
Patient notified

## 2018-04-15 NOTE — Telephone Encounter (Signed)
Patient states that she is having pain in her right foot again and needs a refill on Tramadol.

## 2018-04-16 ENCOUNTER — Other Ambulatory Visit: Payer: Self-pay | Admitting: Family Medicine

## 2018-04-16 MED ORDER — TRAMADOL HCL 50 MG PO TABS: 50.0000 mg | ORAL_TABLET | Freq: Four times a day (QID) | ORAL | 0 refills | Status: DC | PRN

## 2018-04-18 ENCOUNTER — Ambulatory Visit: Payer: Medicaid Other | Admitting: Registered"

## 2018-05-06 ENCOUNTER — Encounter: Payer: Self-pay | Admitting: Registered"

## 2018-05-06 ENCOUNTER — Encounter: Payer: Medicaid Other | Attending: Family Medicine | Admitting: Registered"

## 2018-05-06 DIAGNOSIS — Z6841 Body Mass Index (BMI) 40.0 and over, adult: Secondary | ICD-10-CM | POA: Insufficient documentation

## 2018-05-06 DIAGNOSIS — R7303 Prediabetes: Secondary | ICD-10-CM | POA: Insufficient documentation

## 2018-05-06 DIAGNOSIS — Z713 Dietary counseling and surveillance: Secondary | ICD-10-CM | POA: Insufficient documentation

## 2018-05-06 DIAGNOSIS — I10 Essential (primary) hypertension: Secondary | ICD-10-CM | POA: Insufficient documentation

## 2018-05-06 NOTE — Patient Instructions (Addendum)
Great job on cutting back the soda - I'm sure that has played a big part in reducing your A1c! Consider getting more sleep, or talking to your PCP about doing a sleep study. Consider taking food to work to have some food while working. Fruit & nuts, protein bars, protein drinks are some options. Consider getting into more of a regular routing for meals and snacks. Consider eating 3 balanced meals as you can. After you have more energy consider working in more exercise

## 2018-05-06 NOTE — Progress Notes (Signed)
Medical Nutrition Therapy:  Appt start time: 1000 end time:  1050.  Assessment:  Primary concerns today: Weight and Pre-diabetes (5.6% 03/26/2018, 6.2% 09/24/17, 6.5% 1 yr ago). Pt states she used to drink a lot of coke, now ~7 oz 1-3x week. 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 and started a challenge with coworkers to drink more water (with lemon) instead of soda.  Pt states she has been weight cycling for awhile. Pt wants to lose weight naturally but has an appointment to consider bariatric surgery.   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 Pt states she was hired for 40 hrs/week but often will work overtime and get closer to 50 hrs/wk. Pt states she will be training soon for salaried position with 50 hr obligation. Pt states she is very busy taking care of her children as well; school - gets children ready and picks them up after school, son in football. Pt states she has 63 yr old daughter works at [employer] and picks her up from work at midnight some nights.   Sleep 8-9 pm 5-6 hrs sleep. Pt states she can fall asleep very quickly any time. Pt states her children tell her that she snores a lot. Days off naps during day to try to catch up on sleep.  Stress: Pt states she has some depression, has a lot of demands on her to take care of her children. (provided list of therapists and suggested she look into her employer's mental health benefits) Pt states her youngest daughter's father died last year and has been very hard.  Preferred Learning Style:   No preference indicated   Learning Readiness:   Ready  MEDICATIONS: reviewed, Metformin   DIETARY INTAKE:  Usual eating pattern includes 1-2 meals and 1-2 snacks per day.  24-hr recall:  B ( AM): none OR egg OR pancake OR cereal  Snk ( AM): none OR kelloggs breakfast shake OR fruit L ( PM): none Snk ( PM): none OR dry cereal D (6-7 PM): rice, vegetables (corn,  broccoli, mixed veggies) ckn or seafood Snk ( PM): none Beverages: a lot of water, whole milk or 1% milk (doesn't have a preference -whatever is available with her kids also drinking WIC provided milk), sometimes juice, ~3x week 7 oz coke  Usual physical activity: ADL  Estimated energy needs: 1600 calories  Progress Towards Goal(s):  New goal.   Nutritional Diagnosis:  NI-5.8.4 Inconsistent carbohydrate intake As related to breakfast and snacks being either protein or carb.  As evidenced by dietary recall.    Intervention:  Nutrition Education. Discussed balanced eating and importance of including other food groups when eating carbs. Discussed importance of sleep in weight management, blood sugar control, and overall health. Discussed value of having routines for meals and sleep.   Teaching Method Utilized:  Visual Auditory  Handouts given during visit include:  [facility] brochure  A1c  MyPlate Planner  Barriers to learning/adherence to lifestyle change: work and life obligations  Demonstrated degree of understanding via:  Teach Back   Monitoring/Evaluation:  Dietary intake, exercise, sleep, and body weight in 1 month(s).

## 2018-05-23 ENCOUNTER — Encounter (INDEPENDENT_AMBULATORY_CARE_PROVIDER_SITE_OTHER): Payer: BLUE CROSS/BLUE SHIELD

## 2018-05-27 ENCOUNTER — Encounter: Payer: Self-pay | Admitting: Family Medicine

## 2018-05-27 ENCOUNTER — Ambulatory Visit (INDEPENDENT_AMBULATORY_CARE_PROVIDER_SITE_OTHER): Payer: Self-pay | Admitting: Family Medicine

## 2018-05-27 VITALS — BP 140/96 | HR 88 | Temp 97.9°F | Ht 65.0 in | Wt 331.4 lb

## 2018-05-27 DIAGNOSIS — R634 Abnormal weight loss: Secondary | ICD-10-CM

## 2018-05-27 DIAGNOSIS — I10 Essential (primary) hypertension: Secondary | ICD-10-CM

## 2018-05-27 DIAGNOSIS — M79671 Pain in right foot: Secondary | ICD-10-CM

## 2018-05-27 DIAGNOSIS — Z6841 Body Mass Index (BMI) 40.0 and over, adult: Secondary | ICD-10-CM

## 2018-05-27 DIAGNOSIS — R7303 Prediabetes: Secondary | ICD-10-CM

## 2018-05-27 DIAGNOSIS — Z09 Encounter for follow-up examination after completed treatment for conditions other than malignant neoplasm: Secondary | ICD-10-CM

## 2018-05-27 DIAGNOSIS — E66813 Obesity, class 3: Secondary | ICD-10-CM

## 2018-05-27 DIAGNOSIS — Z8669 Personal history of other diseases of the nervous system and sense organs: Secondary | ICD-10-CM

## 2018-05-27 LAB — POCT URINALYSIS DIP (MANUAL ENTRY)
Bilirubin, UA: NEGATIVE
Blood, UA: NEGATIVE
Glucose, UA: NEGATIVE mg/dL
Ketones, POC UA: NEGATIVE mg/dL
Nitrite, UA: NEGATIVE
Protein Ur, POC: NEGATIVE mg/dL
Spec Grav, UA: 1.025 (ref 1.010–1.025)
Urobilinogen, UA: 0.2 E.U./dL
pH, UA: 5.5 (ref 5.0–8.0)

## 2018-05-27 LAB — POCT GLYCOSYLATED HEMOGLOBIN (HGB A1C): Hemoglobin A1C: 5.7 % — AB (ref 4.0–5.6)

## 2018-05-27 MED ORDER — TRAMADOL HCL 50 MG PO TABS: 50.0000 mg | ORAL_TABLET | Freq: Four times a day (QID) | ORAL | 0 refills | Status: DC | PRN

## 2018-05-27 NOTE — Patient Instructions (Signed)
Heart-Healthy Eating Plan Heart-healthy meal planning includes:  Limiting unhealthy fats.  Increasing healthy fats.  Making other small dietary changes.  You may need to talk with your doctor or a diet specialist (dietitian) to create an eating plan that is right for you. What types of fat should I choose?  Choose healthy fats. These include olive oil and canola oil, flaxseeds, walnuts, almonds, and seeds.  Eat more omega-3 fats. These include salmon, mackerel, sardines, tuna, flaxseed oil, and ground flaxseeds. Try to eat fish at least twice each week.  Limit saturated fats. ? Saturated fats are often found in animal products, such as meats, butter, and cream. ? Plant sources of saturated fats include palm oil, palm kernel oil, and coconut oil.  Avoid foods with partially hydrogenated oils in them. These include stick margarine, some tub margarines, cookies, crackers, and other baked goods. These contain trans fats. What general guidelines do I need to follow?  Check food labels carefully. Identify foods with trans fats or high amounts of saturated fat.  Fill one half of your plate with vegetables and green salads. Eat 4-5 servings of vegetables per day. A serving of vegetables is: ? 1 cup of raw leafy vegetables. ?  cup of raw or cooked cut-up vegetables. ?  cup of vegetable juice.  Fill one fourth of your plate with whole grains. Look for the word "whole" as the first word in the ingredient list.  Fill one fourth of your plate with lean protein foods.  Eat 4-5 servings of fruit per day. A serving of fruit is: ? One medium whole fruit. ?  cup of dried fruit. ?  cup of fresh, frozen, or canned fruit. ?  cup of 100% fruit juice.  Eat more foods that contain soluble fiber. These include apples, broccoli, carrots, beans, peas, and barley. Try to get 20-30 g of fiber per day.  Eat more home-cooked food. Eat less restaurant, buffet, and fast food.  Limit or avoid  alcohol.  Limit foods high in starch and sugar.  Avoid fried foods.  Avoid frying your food. Try baking, boiling, grilling, or broiling it instead. You can also reduce fat by: ? Removing the skin from poultry. ? Removing all visible fats from meats. ? Skimming the fat off of stews, soups, and gravies before serving them. ? Steaming vegetables in water or broth.  Lose weight if you are overweight.  Eat 4-5 servings of nuts, legumes, and seeds per week: ? One serving of dried beans or legumes equals  cup after being cooked. ? One serving of nuts equals 1 ounces. ? One serving of seeds equals  ounce or one tablespoon.  You may need to keep track of how much salt or sodium you eat. This is especially true if you have high blood pressure. Talk with your doctor or dietitian to get more information. What foods can I eat? Grains Breads, including French, white, pita, wheat, raisin, rye, oatmeal, and Italian. Tortillas that are neither fried nor made with lard or trans fat. Low-fat rolls, including hotdog and hamburger buns and English muffins. Biscuits. Muffins. Waffles. Pancakes. Light popcorn. Whole-grain cereals. Flatbread. Melba toast. Pretzels. Breadsticks. Rusks. Low-fat snacks. Low-fat crackers, including oyster, saltine, matzo, graham, animal, and rye. Rice and pasta, including brown rice and pastas that are made with whole wheat. Vegetables All vegetables. Fruits All fruits, but limit coconut. Meats and Other Protein Sources Lean, well-trimmed beef, veal, pork, and lamb. Chicken and turkey without skin. All fish and shellfish.   Wild duck, rabbit, pheasant, and venison. Egg whites or low-cholesterol egg substitutes. Dried beans, peas, lentils, and tofu. Seeds and most nuts. Dairy Low-fat or nonfat cheeses, including ricotta, string, and mozzarella. Skim or 1% milk that is liquid, powdered, or evaporated. Buttermilk that is made with low-fat milk. Nonfat or low-fat  yogurt. Beverages Mineral water. Diet carbonated beverages. Sweets and Desserts Sherbets and fruit ices. Honey, jam, marmalade, jelly, and syrups. Meringues and gelatins. Pure sugar candy, such as hard candy, jelly beans, gumdrops, mints, marshmallows, and small amounts of dark chocolate. [employer] Eat all sweets and desserts in moderation. Fats and Oils Nonhydrogenated (trans-free) margarines. Vegetable oils, including soybean, sesame, sunflower, olive, peanut, safflower, corn, canola, and cottonseed. Salad dressings or mayonnaise made with a vegetable oil. Limit added fats and oils that you use for cooking, baking, salads, and as spreads. Other Cocoa powder. Coffee and tea. All seasonings and condiments. The items listed above may not be a complete list of recommended foods or beverages. Contact your dietitian for more options. What foods are not recommended? Grains Breads that are made with saturated or trans fats, oils, or whole milk. Croissants. Butter rolls. Cheese breads. Sweet rolls. Donuts. Buttered popcorn. Chow mein noodles. High-fat crackers, such as cheese or butter crackers. Meats and Other Protein Sources Fatty meats, such as hotdogs, short ribs, sausage, spareribs, bacon, rib eye roast or steak, and mutton. High-fat deli meats, such as salami and bologna. Caviar. Domestic duck and goose. Organ meats, such as kidney, liver, sweetbreads, and heart. Dairy Cream, sour cream, cream cheese, and creamed cottage cheese. Whole-milk cheeses, including blue (bleu), [place], [place], [place], American, [place], Swiss, cheddar, [place], and [place]. Whole or 2% milk that is liquid, evaporated, or condensed. Whole buttermilk. Cream sauce or high-fat cheese sauce. Yogurt that is made from whole milk. Beverages Regular sodas and juice drinks with added sugar. Sweets and Desserts Frosting. Pudding. Cookies. Cakes other than angel food cake. Candy that has milk chocolate or white  chocolate, hydrogenated fat, butter, coconut, or unknown ingredients. Buttered syrups. Full-fat ice cream or ice cream drinks. Fats and Oils Gravy that has suet, meat fat, or shortening. Cocoa butter, hydrogenated oils, palm oil, coconut oil, palm kernel oil. These can often be found in baked products, candy, fried foods, nondairy creamers, and whipped toppings. Solid fats and shortenings, including bacon fat, salt pork, lard, and butter. Nondairy cream substitutes, such as coffee creamers and sour cream substitutes. Salad dressings that are made of unknown oils, cheese, or sour cream. The items listed above may not be a complete list of foods and beverages to avoid. Contact your dietitian for more information. This information is not intended to replace advice given to you by your health care provider. Make sure you discuss any questions you have with your health care provider. Document Released: 02/06/2012 Document Revised: 01/13/2016 Document Reviewed: 01/29/2014 Elsevier Interactive Patient Education  2018 [employer] Calories are units of energy. Your body needs a certain amount of calories from food to keep you going throughout the day. When you eat more calories than your body needs, your body stores the extra calories as fat. When you eat fewer calories than your body needs, your body burns fat to get the energy it needs. Calorie counting means keeping track of how many calories you eat and drink each day. Calorie counting can be helpful if you need to lose weight. If you make  sure to eat fewer calories than your body needs, you should lose weight. Ask your health care provider what a healthy weight is for you. For calorie counting to work, you will need to eat the right number of calories in a day in order to lose a healthy amount of weight per week. A dietitian can help you determine how many calories you need in a day and will give you  suggestions on how to reach your calorie goal.  A healthy amount of weight to lose per week is usually 1-2 lb (0.5-0.9 kg). This usually means that your daily calorie intake should be reduced by 500-750 calories.  Eating 1,200 - 1,500 calories per day can help most women lose weight.  Eating 1,500 - 1,800 calories per day can help most men lose weight.  What is my plan? My goal is to have __________ calories per day. If I have this many calories per day, I should lose around __________ pounds per week. What do I need to know about calorie counting? In order to meet your daily calorie goal, you will need to:  Find out how many calories are in each food you would like to eat. Try to do this before you eat.  Decide how much of the food you plan to eat.  Write down what you ate and how many calories it had. Doing this is called keeping a food log.  To successfully lose weight, it is important to balance calorie counting with a healthy lifestyle that includes regular activity. Aim for 150 minutes of moderate exercise (such as walking) or 75 minutes of vigorous exercise (such as running) each week. Where do I find calorie information?  The number of calories in a food can be found on a Nutrition Facts label. If a food does not have a Nutrition Facts label, try to look up the calories online or ask your dietitian for help. Remember that calories are listed per serving. If you choose to have more than one serving of a food, you will have to multiply the calories per serving by the amount of servings you plan to eat. For example, the label on a package of bread might say that a serving size is 1 slice and that there are 90 calories in a serving. If you eat 1 slice, you will have eaten 90 calories. If you eat 2 slices, you will have eaten 180 calories. How do I keep a food log? Immediately after each meal, record the following information in your food log:  What you ate. Don't forget to include  toppings, sauces, and other extras on the food.  How much you ate. This can be measured in cups, ounces, or number of items.  How many calories each food and drink had.  The total number of calories in the meal.  Keep your food log near you, such as in a small notebook in your pocket, or use a mobile app or website. Some programs will calculate calories for you and show you how many calories you have left for the day to meet your goal. What are some calorie counting tips?  Use your calories on foods and drinks that will fill you up and not leave you hungry: ? Some examples of foods that fill you up are nuts and nut butters, vegetables, lean proteins, and high-fiber foods like whole grains. High-fiber foods are foods with more than 5 g fiber per serving. ? Drinks such as sodas, specialty coffee drinks, alcohol, and  juices have a lot of calories, yet do not fill you up.  Eat nutritious foods and avoid empty calories. Empty calories are calories you get from foods or beverages that do not have many vitamins or protein, such as candy, sweets, and soda. It is better to have a nutritious high-calorie food (such as an avocado) than a food with few nutrients (such as a bag of chips).  Know how many calories are in the foods you eat most often. This will help you calculate calorie counts faster.  Pay attention to calories in drinks. Low-calorie drinks include water and unsweetened drinks.  Pay attention to nutrition labels for "low fat" or "fat free" foods. These foods sometimes have the same amount of calories or more calories than the full fat versions. They also often have added sugar, starch, or salt, to make up for flavor that was removed with the fat.  Find a way of tracking calories that works for you. Get creative. Try different apps or programs if writing down calories does not work for you. What are some portion control tips?  Know how many calories are in a serving. This will help you  know how many servings of a certain food you can have.  Use a measuring cup to measure serving sizes. You could also try weighing out portions on a kitchen scale. With time, you will be able to estimate serving sizes for some foods.  Take some time to put servings of different foods on your favorite plates, bowls, and cups so you know what a serving looks like.  Try not to eat straight from a bag or box. Doing this can lead to overeating. Put the amount you would like to eat in a cup or on a plate to make sure you are eating the right portion.  Use smaller plates, glasses, and bowls to prevent overeating.  Try not to multitask (for example, watch TV or use your computer) while eating. If it is time to eat, sit down at a table and enjoy your food. This will help you to know when you are full. It will also help you to be aware of what you are eating and how much you are eating. What are tips for following this plan? Reading food labels  Check the calorie count compared to the serving size. The serving size may be smaller than what you are used to eating.  Check the source of the calories. Make sure the food you are eating is high in vitamins and protein and low in saturated and trans fats. Shopping  Read nutrition labels while you shop. This will help you make healthy decisions before you decide to purchase your food.  Make a grocery list and stick to it. Cooking  Try to cook your favorite foods in a healthier way. For example, try baking instead of frying.  Use low-fat dairy products. Meal planning  Use more fruits and vegetables. Half of your plate should be fruits and vegetables.  Include lean proteins like poultry and fish. How do I count calories when eating out?  Ask for smaller portion sizes.  Consider sharing an entree and sides instead of getting your own entree.  If you get your own entree, eat only half. Ask for a box at the beginning of your meal and put the rest of  your entree in it so you are not tempted to eat it.  If calories are listed on the menu, choose the lower calorie options.  Choose  dishes that include vegetables, fruits, whole grains, low-fat dairy products, and lean protein.  Choose items that are boiled, broiled, grilled, or steamed. Stay away from items that are buttered, battered, fried, or served with cream sauce. Items labeled "crispy" are usually fried, unless stated otherwise.  Choose water, low-fat milk, unsweetened iced tea, or other drinks without added sugar. If you want an alcoholic beverage, choose a lower calorie option such as a glass of wine or light beer.  Ask for dressings, sauces, and syrups on the side. These are usually high in calories, so you should limit the amount you eat.  If you want a salad, choose a garden salad and ask for grilled meats. Avoid extra toppings like bacon, cheese, or fried items. Ask for the dressing on the side, or ask for olive oil and vinegar or lemon to use as dressing.  Estimate how many servings of a food you are given. For example, a serving of cooked rice is  cup or about the size of half a baseball. Knowing serving sizes will help you be aware of how much food you are eating at restaurants. The list below tells you how big or small some common portion sizes are based on everyday objects: ? 1 oz-4 stacked dice. ? 3 oz-1 deck of cards. ? 1 tsp-1 die. ? 1 Tbsp- a ping-pong ball. ? 2 Tbsp-1 ping-pong ball. ?  cup- baseball. ? 1 cup-1 baseball. Summary  Calorie counting means keeping track of how many calories you eat and drink each day. If you eat fewer calories than your body needs, you should lose weight.  A healthy amount of weight to lose per week is usually 1-2 lb (0.5-0.9 kg). This usually means reducing your daily calorie intake by 500-750 calories.  The number of calories in a food can be found on a Nutrition Facts label. If a food does not have a Nutrition Facts label, try to  look up the calories online or ask your dietitian for help.  Use your calories on foods and drinks that will fill you up, and not on foods and drinks that will leave you hungry.  Use smaller plates, glasses, and bowls to prevent overeating. This information is not intended to replace advice given to you by your health care provider. Make sure you discuss any questions you have with your health care provider. Document Released: 08/07/2005 Document Revised: 07/07/2016 Document Reviewed: 07/07/2016 Elsevier Interactive Patient Education  2018 [place] Eating Plan DASH stands for "Dietary Approaches to Stop Hypertension." The DASH eating plan is a healthy eating plan that has been shown to reduce high blood pressure (hypertension). It may also reduce your risk for type 2 diabetes, heart disease, and stroke. The DASH eating plan may also help with weight loss. What are tips for following this plan? General guidelines  Avoid eating more than 2,300 mg (milligrams) of salt (sodium) a day. If you have hypertension, you may need to reduce your sodium intake to 1,500 mg a day.  Limit alcohol intake to no more than 1 drink a day for nonpregnant women and 2 drinks a day for men. One drink equals 12 oz of beer, 5 oz of wine, or 1 oz of hard liquor.  Work with your health care provider to maintain a healthy body weight or to lose weight. Ask what an ideal weight is for you.  Get at least 30 minutes of exercise that causes your heart to beat faster (  aerobic exercise) most days of the week. Activities may include walking, swimming, or biking.  Work with your health care provider or diet and nutrition specialist (dietitian) to adjust your eating plan to your individual calorie needs. Reading food labels  Check food labels for the amount of sodium per serving. Choose foods with less than 5 percent of the Daily Value of sodium. Generally, foods with less than 300 mg of sodium per serving  fit into this eating plan.  To find whole grains, look for the word "whole" as the first word in the ingredient list. Shopping  Buy products labeled as "low-sodium" or "no salt added."  Buy fresh foods. Avoid canned foods and premade or frozen meals. Cooking  Avoid adding salt when cooking. Use salt-free seasonings or herbs instead of table salt or sea salt. Check with your health care provider or pharmacist before using salt substitutes.  Do not fry foods. Cook foods using healthy methods such as baking, boiling, grilling, and broiling instead.  Cook with heart-healthy oils, such as olive, canola, soybean, or sunflower oil. Meal planning   Eat a balanced diet that includes: ? 5 or more servings of fruits and vegetables each day. At each meal, try to fill half of your plate with fruits and vegetables. ? Up to 6-8 servings of whole grains each day. ? Less than 6 oz of lean meat, poultry, or fish each day. A 3-oz serving of meat is about the same size as a deck of cards. One egg equals 1 oz. ? 2 servings of low-fat dairy each day. ? A serving of nuts, seeds, or beans 5 times each week. ? Heart-healthy fats. Healthy fats called Omega-3 fatty acids are found in foods such as flaxseeds and coldwater fish, like sardines, salmon, and mackerel.  Limit how much you eat of the following: ? Canned or prepackaged foods. ? Food that is high in trans fat, such as fried foods. ? Food that is high in saturated fat, such as fatty meat. ? Sweets, desserts, sugary drinks, and other foods with added sugar. ? Full-fat dairy products.  Do not salt foods before eating.  Try to eat at least 2 vegetarian meals each week.  Eat more home-cooked food and less restaurant, buffet, and fast food.  When eating at a restaurant, ask that your food be prepared with less salt or no salt, if possible. What foods are recommended? The items listed may not be a complete list. Talk with your dietitian about what  dietary choices are best for you. Grains Whole-grain or whole-wheat bread. Whole-grain or whole-wheat pasta. Brown rice. [place]. Bulgur. Whole-grain and low-sodium cereals. Pita bread. Low-fat, low-sodium crackers. Whole-wheat flour tortillas. Vegetables Fresh or frozen vegetables (raw, steamed, roasted, or grilled). Low-sodium or reduced-sodium tomato and vegetable juice. Low-sodium or reduced-sodium tomato sauce and tomato paste. Low-sodium or reduced-sodium canned vegetables. Fruits All fresh, dried, or frozen fruit. Canned fruit in natural juice (without added sugar). Meat and other protein foods Skinless chicken or [place]. Ground chicken or [place]. Pork with fat trimmed off. Fish and seafood. Egg whites. Dried beans, peas, or lentils. Unsalted nuts, nut butters, and seeds. Unsalted canned beans. Lean cuts of beef with fat trimmed off. Low-sodium, lean deli meat. Dairy Low-fat (1%) or fat-free (skim) milk. Fat-free, low-fat, or reduced-fat cheeses. Nonfat, low-sodium ricotta or cottage cheese. Low-fat or nonfat yogurt. Low-fat, low-sodium cheese. Fats and oils Soft margarine without trans fats. Vegetable oil. Low-fat, reduced-fat, or light mayonnaise and salad dressings (reduced-sodium). Canola,  safflower, olive, soybean, and sunflower oils. Avocado. Seasoning and other foods Herbs. Spices. Seasoning mixes without salt. Unsalted popcorn and pretzels. Fat-free sweets. What foods are not recommended? The items listed may not be a complete list. Talk with your dietitian about what dietary choices are best for you. Grains Baked goods made with fat, such as croissants, muffins, or some breads. Dry pasta or rice meal packs. Vegetables Creamed or fried vegetables. Vegetables in a cheese sauce. Regular canned vegetables (not low-sodium or reduced-sodium). Regular canned tomato sauce and paste (not low-sodium or reduced-sodium). Regular tomato and vegetable juice (not low-sodium or  reduced-sodium). Angie Fava. Olives. Fruits Canned fruit in a light or heavy syrup. Fried fruit. Fruit in cream or butter sauce. Meat and other protein foods Fatty cuts of meat. Ribs. Fried meat. [place] Salines. Sausage. Bologna and other processed lunch meats. Salami. Fatback. Hotdogs. Bratwurst. Salted nuts and seeds. Canned beans with added salt. Canned or smoked fish. Whole eggs or egg yolks. Chicken or [place] with skin. Dairy Whole or 2% milk, cream, and half-and-half. Whole or full-fat cream cheese. Whole-fat or sweetened yogurt. Full-fat cheese. Nondairy creamers. Whipped toppings. Processed cheese and cheese spreads. Fats and oils Butter. Stick margarine. Lard. Shortening. Ghee. Bacon fat. Tropical oils, such as coconut, palm kernel, or palm oil. Seasoning and other foods Salted popcorn and pretzels. Onion salt, garlic salt, seasoned salt, table salt, and sea salt. Worcestershire sauce. Tartar sauce. Barbecue sauce. Teriyaki sauce. Soy sauce, including reduced-sodium. Steak sauce. Canned and packaged gravies. Fish sauce. Oyster sauce. Cocktail sauce. Horseradish that you find on the shelf. Ketchup. Mustard. Meat flavorings and tenderizers. Bouillon cubes. Hot sauce and Tabasco sauce. Premade or packaged marinades. Premade or packaged taco seasonings. Relishes. Regular salad dressings. Where to find more information:  National Heart, Lung, and [place]: https://wilson-eaton.com/  American Heart Association: www.heart.org Summary  The DASH eating plan is a healthy eating plan that has been shown to reduce high blood pressure (hypertension). It may also reduce your risk for type 2 diabetes, heart disease, and stroke.  With the DASH eating plan, you should limit salt (sodium) intake to 2,300 mg a day. If you have hypertension, you may need to reduce your sodium intake to 1,500 mg a day.  When on the DASH eating plan, aim to eat more fresh fruits and vegetables, whole grains, lean proteins, low-fat  dairy, and heart-healthy fats.  Work with your health care provider or diet and nutrition specialist (dietitian) to adjust your eating plan to your individual calorie needs. This information is not intended to replace advice given to you by your health care provider. Make sure you discuss any questions you have with your health care provider. Document Released: 07/27/2011 Document Revised: 07/31/2016 Document Reviewed: 07/31/2016 Elsevier Interactive Patient Education  [employer].

## 2018-05-27 NOTE — Progress Notes (Signed)
Subjective:    Patient ID: Chloe Castillo, female    DOB: February 01, 1981, 37 y.o.   MRN: [id number]  HPI  Ms. Rippetoe is a 37 year old female with Hypertension. She is here today for follow up.   Current Status: Since her last office visit, she is doing well with no complaints. She is concerned about her weight gain today. She is currently attending Weight Loss program on [address], with her next visit on 06/05/2018, classes every 2 weeks. She has not had any recent Migraines. She continues to have mild right ankle/feet pain and back pain.   She denies fevers, chills, fatigue, recent infections, weight loss, and night sweats. She has not had any headaches, visual changes, dizziness, and falls. No chest pain, heart palpitations, cough and shortness of breath reported. No reports of GI problems such as nausea, vomiting, diarrhea, and constipation. She has no reports of blood in stools, dysuria and hematuria. No depression or anxiety reported.   Review of Systems  Constitutional: Negative.   HENT: Negative.   Respiratory: Negative.   Cardiovascular: Negative.   Gastrointestinal: Positive for abdominal distention (Obese).  Genitourinary: Negative.   Musculoskeletal: Positive for arthralgias (right ankle/foot pain).  Skin: Negative.   Neurological: Negative.   Psychiatric/Behavioral: Negative.    Objective:   Physical Exam  Constitutional: She is oriented to person, place, and time. She appears well-developed and well-nourished.  Cardiovascular: Normal rate, regular rhythm, normal heart sounds and intact distal pulses.  Pulmonary/Chest: Effort normal and breath sounds normal.  Abdominal: Soft. Bowel sounds are normal. She exhibits distension (Obese).  Musculoskeletal:  Limited ROM in right ankle/foot.   Neurological: She is alert and oriented to person, place, and time.  Skin: Skin is warm and dry.  Psychiatric: She has a normal mood and affect. Her behavior is normal. Thought content normal.   Nursing note and vitals reviewed.  Assessment & Plan:   1. Class 3 severe obesity due to excess calories with serious comorbidity and body mass index (BMI) of 50.0 to 59.9 in adult [facility]) Body mass index is 55.15 kg/m.  Goal BMI  is <30. Encouraged efforts to reduce weight include engaging in physical activity as tolerated with goal of 150 minutes per week. Improve dietary choices and eat a meal regimen consistent with a Mediterranean or DASH diet. Reduce simple carbohydrates. Do not skip meals and eat healthy snacks throughout the day to avoid over-eating at dinner. Set a goal weight loss that is achievable for you.   2. Decreased weight She has a 3 lb weight loss in 2 months. She is encouraged to continue Weight Loss program and healthy eating. Information on calorie counting given to patient today, per AVS. We will possibly refer her to [facility] if needed in the future. Monitor.    3. Essential hypertension Blood pressure elevated today. Continue Lisinopril as prescribed. We will re-evaluate blood pressure at next office visit.  4. Prediabetes Hgb A1c mildly increased to 5.7 today, from 5.6 on 03/26/2018. Continue Metformin as prescribed. She will continue to decrease foods/beverages high in sugars and carbs and follow Heart Healthy or DASH diet. Increase physical activity to at least 30 minutes cardio exercise daily.  - POCT glycosylated hemoglobin (Hb A1C) - POCT urinalysis dipstick  5. Foot pain, right Right ankle/foot pain r/t previous trauma. We will refill Tramadol today. - traMADol (ULTRAM) 50 MG tablet; Take 1 tablet (50 mg total) by mouth every 6 (six) hours as needed for moderate pain.  Dispense:  30 tablet; Refill: 0  6. Hx of migraine headaches Stable. Continue Motrin and Imitrex as needed.   7. Follow up She will follow up in 4 months.    Meds ordered this encounter  Medications  . traMADol (ULTRAM) 50 MG tablet    Sig: Take 1 tablet (50 mg total) by mouth every  6 (six) hours as needed for moderate pain.    Dispense:  30 tablet    Refill:  0    Order Specific Question:   Supervising Provider    Answer:   Tresa Garter [[id number]]    [name],  MSN, FNP-C [facility] [address] [place], [place] [address] [phone]

## 2018-06-05 ENCOUNTER — Ambulatory Visit (INDEPENDENT_AMBULATORY_CARE_PROVIDER_SITE_OTHER): Payer: BLUE CROSS/BLUE SHIELD | Admitting: Family Medicine

## 2018-06-05 ENCOUNTER — Encounter (INDEPENDENT_AMBULATORY_CARE_PROVIDER_SITE_OTHER): Payer: Self-pay

## 2018-06-10 ENCOUNTER — Ambulatory Visit: Payer: Medicaid Other | Admitting: Registered"

## 2018-06-24 ENCOUNTER — Ambulatory Visit: Payer: Medicaid Other | Admitting: Registered"

## 2018-07-12 ENCOUNTER — Ambulatory Visit: Payer: Medicaid Other | Admitting: Family Medicine

## 2018-07-25 DIAGNOSIS — M79671 Pain in right foot: Secondary | ICD-10-CM | POA: Diagnosis not present

## 2018-07-25 DIAGNOSIS — M722 Plantar fascial fibromatosis: Secondary | ICD-10-CM | POA: Diagnosis not present

## 2018-07-25 DIAGNOSIS — M12271 Villonodular synovitis (pigmented), right ankle and foot: Secondary | ICD-10-CM | POA: Diagnosis not present

## 2018-07-25 DIAGNOSIS — M79672 Pain in left foot: Secondary | ICD-10-CM | POA: Diagnosis not present

## 2018-07-25 DIAGNOSIS — M71571 Other bursitis, not elsewhere classified, right ankle and foot: Secondary | ICD-10-CM | POA: Diagnosis not present

## 2018-07-25 DIAGNOSIS — M7751 Other enthesopathy of right foot: Secondary | ICD-10-CM | POA: Diagnosis not present

## 2018-07-25 DIAGNOSIS — M71572 Other bursitis, not elsewhere classified, left ankle and foot: Secondary | ICD-10-CM | POA: Diagnosis not present

## 2018-07-25 DIAGNOSIS — M7662 Achilles tendinitis, left leg: Secondary | ICD-10-CM | POA: Diagnosis not present

## 2018-07-25 DIAGNOSIS — M7661 Achilles tendinitis, right leg: Secondary | ICD-10-CM | POA: Diagnosis not present

## 2018-07-29 ENCOUNTER — Ambulatory Visit: Payer: Medicaid Other | Admitting: Family Medicine

## 2018-08-28 DIAGNOSIS — M71571 Other bursitis, not elsewhere classified, right ankle and foot: Secondary | ICD-10-CM | POA: Diagnosis not present

## 2018-08-28 DIAGNOSIS — M7661 Achilles tendinitis, right leg: Secondary | ICD-10-CM | POA: Diagnosis not present

## 2018-08-28 DIAGNOSIS — M7662 Achilles tendinitis, left leg: Secondary | ICD-10-CM | POA: Diagnosis not present

## 2018-09-30 ENCOUNTER — Encounter: Payer: Self-pay | Admitting: Family Medicine

## 2018-09-30 ENCOUNTER — Ambulatory Visit (INDEPENDENT_AMBULATORY_CARE_PROVIDER_SITE_OTHER): Payer: Self-pay | Admitting: Family Medicine

## 2018-09-30 VITALS — BP 144/90 | HR 64 | Temp 97.8°F | Ht 65.0 in | Wt 344.0 lb

## 2018-09-30 DIAGNOSIS — R8290 Unspecified abnormal findings in urine: Secondary | ICD-10-CM | POA: Diagnosis not present

## 2018-09-30 DIAGNOSIS — R7303 Prediabetes: Secondary | ICD-10-CM

## 2018-09-30 DIAGNOSIS — Z6841 Body Mass Index (BMI) 40.0 and over, adult: Secondary | ICD-10-CM

## 2018-09-30 DIAGNOSIS — M79672 Pain in left foot: Secondary | ICD-10-CM

## 2018-09-30 DIAGNOSIS — I10 Essential (primary) hypertension: Secondary | ICD-10-CM

## 2018-09-30 DIAGNOSIS — Z09 Encounter for follow-up examination after completed treatment for conditions other than malignant neoplasm: Secondary | ICD-10-CM

## 2018-09-30 DIAGNOSIS — M79671 Pain in right foot: Secondary | ICD-10-CM

## 2018-09-30 DIAGNOSIS — E66813 Obesity, class 3: Secondary | ICD-10-CM

## 2018-09-30 LAB — POCT URINALYSIS DIP (MANUAL ENTRY)
Bilirubin, UA: NEGATIVE
Blood, UA: NEGATIVE
Glucose, UA: NEGATIVE mg/dL
Ketones, POC UA: NEGATIVE mg/dL
Nitrite, UA: POSITIVE — AB
Protein Ur, POC: NEGATIVE mg/dL
Spec Grav, UA: 1.02 (ref 1.010–1.025)
Urobilinogen, UA: 0.2 E.U./dL
pH, UA: 5.5 (ref 5.0–8.0)

## 2018-09-30 LAB — POCT GLYCOSYLATED HEMOGLOBIN (HGB A1C): Hemoglobin A1C: 5.5 % (ref 4.0–5.6)

## 2018-09-30 NOTE — Patient Instructions (Signed)
Carbohydrate Counting for Diabetes Mellitus, Adult  Carbohydrate counting is a method of keeping track of how many carbohydrates you eat. Eating carbohydrates naturally increases the amount of sugar (glucose) in the blood. Counting how many carbohydrates you eat helps keep your blood glucose within normal limits, which helps you manage your diabetes (diabetes mellitus). It is important to know how many carbohydrates you can safely have in each meal. This is different for every person. A diet and nutrition specialist (registered dietitian) can help you make a meal plan and calculate how many carbohydrates you should have at each meal and snack. Carbohydrates are found in the following foods:  Grains, such as breads and cereals.  Dried beans and soy products.  Starchy vegetables, such as potatoes, peas, and corn.  Fruit and fruit juices.  Milk and yogurt.  Sweets and snack foods, such as cake, cookies, candy, chips, and soft drinks. How do I count carbohydrates? There are two ways to count carbohydrates in food. You can use either of the methods or a combination of both. Reading "Nutrition Facts" on packaged food The "Nutrition Facts" list is included on the labels of almost all packaged foods and beverages in the [place] It includes:  The serving size.  Information about nutrients in each serving, including the grams (g) of carbohydrate per serving. To use the "Nutrition Facts":  Decide how many servings you will have.  Multiply the number of servings by the number of carbohydrates per serving.  The resulting number is the total amount of carbohydrates that you will be having. Learning standard serving sizes of other foods When you eat carbohydrate foods that are not packaged or do not include "Nutrition Facts" on the label, you need to measure the servings in order to count the amount of carbohydrates:  Measure the foods that you will eat with a food scale or measuring cup, if  needed.  Decide how many standard-size servings you will eat.  Multiply the number of servings by 15. Most carbohydrate-rich foods have about 15 g of carbohydrates per serving. ? For example, if you eat 8 oz (170 g) of strawberries, you will have eaten 2 servings and 30 g of carbohydrates (2 servings x 15 g = 30 g).  For foods that have more than one food mixed, such as soups and casseroles, you must count the carbohydrates in each food that is included. The following list contains standard serving sizes of common carbohydrate-rich foods. Each of these servings has about 15 g of carbohydrates:   hamburger bun or  English muffin.   oz (15 mL) syrup.   oz (14 g) jelly.  1 slice of bread.  1 six-inch tortilla.  3 oz (85 g) cooked rice or pasta.  4 oz (113 g) cooked dried beans.  4 oz (113 g) starchy vegetable, such as peas, corn, or potatoes.  4 oz (113 g) hot cereal.  4 oz (113 g) mashed potatoes or  of a large baked potato.  4 oz (113 g) canned or frozen fruit.  4 oz (120 mL) fruit juice.  4-6 crackers.  6 chicken nuggets.  6 oz (170 g) unsweetened dry cereal.  6 oz (170 g) plain fat-free yogurt or yogurt sweetened with artificial sweeteners.  8 oz (240 mL) milk.  8 oz (170 g) fresh fruit or one small piece of fruit.  24 oz (680 g) popped popcorn. Example of carbohydrate counting Sample meal  3 oz (85 g) chicken breast.  6 oz (170 g)   brown rice.  4 oz (113 g) corn.  8 oz (240 mL) milk.  8 oz (170 g) strawberries with sugar-free whipped topping. Carbohydrate calculation 1. Identify the foods that contain carbohydrates: ? Rice. ? Corn. ? Milk. ? Strawberries. 2. Calculate how many servings you have of each food: ? 2 servings rice. ? 1 serving corn. ? 1 serving milk. ? 1 serving strawberries. 3. Multiply each number of servings by 15 g: ? 2 servings rice x 15 g = 30 g. ? 1 serving corn x 15 g = 15 g. ? 1 serving milk x 15 g = 15 g. ? 1  serving strawberries x 15 g = 15 g. 4. Add together all of the amounts to find the total grams of carbohydrates eaten: ? 30 g + 15 g + 15 g + 15 g = 75 g of carbohydrates total. Summary  Carbohydrate counting is a method of keeping track of how many carbohydrates you eat.  Eating carbohydrates naturally increases the amount of sugar (glucose) in the blood.  Counting how many carbohydrates you eat helps keep your blood glucose within normal limits, which helps you manage your diabetes.  A diet and nutrition specialist (registered dietitian) can help you make a meal plan and calculate how many carbohydrates you should have at each meal and snack. This information is not intended to replace advice given to you by your health care provider. Make sure you discuss any questions you have with your health care provider. Document Released: 08/07/2005 Document Revised: 02/14/2017 Document Reviewed: 01/19/2016 Elsevier Interactive Patient Education  2019 [employer] Calories are units of energy. Your body needs a certain amount of calories from food to keep you going throughout the day. When you eat more calories than your body needs, your body stores the extra calories as fat. When you eat fewer calories than your body needs, your body burns fat to get the energy it needs. Calorie counting means keeping track of how many calories you eat and drink each day. Calorie counting can be helpful if you need to lose weight. If you make sure to eat fewer calories than your body needs, you should lose weight. Ask your health care provider what a healthy weight is for you. For calorie counting to work, you will need to eat the right number of calories in a day in order to lose a healthy amount of weight per week. A dietitian can help you determine how many calories you need in a day and will give you suggestions on how to reach your calorie goal.  A healthy amount of weight to lose  per week is usually 1-2 lb (0.5-0.9 kg). This usually means that your daily calorie intake should be reduced by 500-750 calories.  Eating 1,200 - 1,500 calories per day can help most women lose weight.  Eating 1,500 - 1,800 calories per day can help most men lose weight. What is my plan? My goal is to have __________ calories per day. If I have this many calories per day, I should lose around __________ pounds per week. What do I need to know about calorie counting? In order to meet your daily calorie goal, you will need to:  Find out how many calories are in each food you would like to eat. Try to do this before you eat.  Decide how much of the food you plan to eat.  Write down what you ate and how many calories it had.  Doing this is called keeping a food log. To successfully lose weight, it is important to balance calorie counting with a healthy lifestyle that includes regular activity. Aim for 150 minutes of moderate exercise (such as walking) or 75 minutes of vigorous exercise (such as running) each week. Where do I find calorie information?  The number of calories in a food can be found on a Nutrition Facts label. If a food does not have a Nutrition Facts label, try to look up the calories online or ask your dietitian for help. Remember that calories are listed per serving. If you choose to have more than one serving of a food, you will have to multiply the calories per serving by the amount of servings you plan to eat. For example, the label on a package of bread might say that a serving size is 1 slice and that there are 90 calories in a serving. If you eat 1 slice, you will have eaten 90 calories. If you eat 2 slices, you will have eaten 180 calories. How do I keep a food log? Immediately after each meal, record the following information in your food log:  What you ate. Don't forget to include toppings, sauces, and other extras on the food.  How much you ate. This can be measured in  cups, ounces, or number of items.  How many calories each food and drink had.  The total number of calories in the meal. Keep your food log near you, such as in a small notebook in your pocket, or use a mobile app or website. Some programs will calculate calories for you and show you how many calories you have left for the day to meet your goal. What are some calorie counting tips?   Use your calories on foods and drinks that will fill you up and not leave you hungry: ? Some examples of foods that fill you up are nuts and nut butters, vegetables, lean proteins, and high-fiber foods like whole grains. High-fiber foods are foods with more than 5 g fiber per serving. ? Drinks such as sodas, specialty coffee drinks, alcohol, and juices have a lot of calories, yet do not fill you up.  Eat nutritious foods and avoid empty calories. Empty calories are calories you get from foods or beverages that do not have many vitamins or protein, such as candy, sweets, and soda. It is better to have a nutritious high-calorie food (such as an avocado) than a food with few nutrients (such as a bag of chips).  Know how many calories are in the foods you eat most often. This will help you calculate calorie counts faster.  Pay attention to calories in drinks. Low-calorie drinks include water and unsweetened drinks.  Pay attention to nutrition labels for "low fat" or "fat free" foods. These foods sometimes have the same amount of calories or more calories than the full fat versions. They also often have added sugar, starch, or salt, to make up for flavor that was removed with the fat.  Find a way of tracking calories that works for you. Get creative. Try different apps or programs if writing down calories does not work for you. What are some portion control tips?  Know how many calories are in a serving. This will help you know how many servings of a certain food you can have.  Use a measuring cup to measure serving  sizes. You could also try weighing out portions on a kitchen scale. With time, you will  be able to estimate serving sizes for some foods.  Take some time to put servings of different foods on your favorite plates, bowls, and cups so you know what a serving looks like.  Try not to eat straight from a bag or box. Doing this can lead to overeating. Put the amount you would like to eat in a cup or on a plate to make sure you are eating the right portion.  Use smaller plates, glasses, and bowls to prevent overeating.  Try not to multitask (for example, watch TV or use your computer) while eating. If it is time to eat, sit down at a table and enjoy your food. This will help you to know when you are full. It will also help you to be aware of what you are eating and how much you are eating. What are tips for following this plan? Reading food labels  Check the calorie count compared to the serving size. The serving size may be smaller than what you are used to eating.  Check the source of the calories. Make sure the food you are eating is high in vitamins and protein and low in saturated and trans fats. Shopping  Read nutrition labels while you shop. This will help you make healthy decisions before you decide to purchase your food.  Make a grocery list and stick to it. Cooking  Try to cook your favorite foods in a healthier way. For example, try baking instead of frying.  Use low-fat dairy products. Meal planning  Use more fruits and vegetables. Half of your plate should be fruits and vegetables.  Include lean proteins like poultry and fish. How do I count calories when eating out?  Ask for smaller portion sizes.  Consider sharing an entree and sides instead of getting your own entree.  If you get your own entree, eat only half. Ask for a box at the beginning of your meal and put the rest of your entree in it so you are not tempted to eat it.  If calories are listed on the menu, choose  the lower calorie options.  Choose dishes that include vegetables, fruits, whole grains, low-fat dairy products, and lean protein.  Choose items that are boiled, broiled, grilled, or steamed. Stay away from items that are buttered, battered, fried, or served with cream sauce. Items labeled "crispy" are usually fried, unless stated otherwise.  Choose water, low-fat milk, unsweetened iced tea, or other drinks without added sugar. If you want an alcoholic beverage, choose a lower calorie option such as a glass of wine or light beer.  Ask for dressings, sauces, and syrups on the side. These are usually high in calories, so you should limit the amount you eat.  If you want a salad, choose a garden salad and ask for grilled meats. Avoid extra toppings like bacon, cheese, or fried items. Ask for the dressing on the side, or ask for olive oil and vinegar or lemon to use as dressing.  Estimate how many servings of a food you are given. For example, a serving of cooked rice is  cup or about the size of half a baseball. Knowing serving sizes will help you be aware of how much food you are eating at restaurants. The list below tells you how big or small some common portion sizes are based on everyday objects: ? 1 oz-4 stacked dice. ? 3 oz-1 deck of cards. ? 1 tsp-1 die. ? 1 Tbsp- a ping-pong ball. ? 2 Tbsp-1  ping-pong ball. ?  cup- baseball. ? 1 cup-1 baseball. Summary  Calorie counting means keeping track of how many calories you eat and drink each day. If you eat fewer calories than your body needs, you should lose weight.  A healthy amount of weight to lose per week is usually 1-2 lb (0.5-0.9 kg). This usually means reducing your daily calorie intake by 500-750 calories.  The number of calories in a food can be found on a Nutrition Facts label. If a food does not have a Nutrition Facts label, try to look up the calories online or ask your dietitian for help.  Use your calories on foods and  drinks that will fill you up, and not on foods and drinks that will leave you hungry.  Use smaller plates, glasses, and bowls to prevent overeating. This information is not intended to replace advice given to you by your health care provider. Make sure you discuss any questions you have with your health care provider. Document Released: 08/07/2005 Document Revised: 04/26/2018 Document Reviewed: 07/07/2016 Elsevier Interactive Patient Education  2019 [employer].

## 2018-09-30 NOTE — Progress Notes (Signed)
[facility]  Established Patient Office Visit  Subjective:  Patient ID: Chloe Castillo, female    DOB: February 11, 1981  Age: 38 y.o. MRN: [id number]  CC:  Chief Complaint  Patient presents with  . Follow-up    Chronic condition   . Foot Pain    right   HPI Chloe Castillo is a 38 year old female what presents for follow up.   Past Medical History:  Diagnosis Date  . Hypertension    Current Status: Since her last office visit, she has c/o bilateral foot pain. Continues foot injections per Podiatry, with no relief. She is concerned about her weight. She denies visual changes, chest pain, cough, shortness of breath, heart palpitations, and falls. She has occasional headaches and dizziness with position changes. Denies severe headaches, confusion, seizures, double vision, and blurred vision, nausea and vomiting.  She denies fevers, chills, fatigue, recent infections, weight loss, and night sweats.  No reports of GI problems such as diarrhea, and constipation. She has no reports of blood in stools, dysuria and hematuria. No depression or anxiety reported.   No past surgical history on file.  Family History  Problem Relation Age of Onset  . Hypertension Mother   . Hypertension Father     Social History   Socioeconomic History  . Marital status: Single    Spouse name: Not on file  . Number of children: Not on file  . Years of education: Not on file  . Highest education level: Not on file  Occupational History  . Not on file  Social Needs  . Financial resource strain: Not on file  . Food insecurity:    Worry: Not on file    Inability: Not on file  . Transportation needs:    Medical: Not on file    Non-medical: Not on file  Tobacco Use  . Smoking status: Never Smoker  . Smokeless tobacco: Never Used  Substance and Sexual Activity  . Alcohol use: No  . Drug use: No  . Sexual activity: Never  Lifestyle  . Physical activity:   Days per week: Not on file    Minutes per session: Not on file  . Stress: Not on file  Relationships  . Social connections:    Talks on phone: Not on file    Gets together: Not on file    Attends religious service: Not on file    Active member of club or organization: Not on file    Attends meetings of clubs or organizations: Not on file    Relationship status: Not on file  . Intimate partner violence:    Fear of current or ex partner: Not on file    Emotionally abused: Not on file    Physically abused: Not on file    Forced sexual activity: Not on file  Other Topics Concern  . Not on file  Social History Narrative  . Not on file    Outpatient Medications Prior to Visit  Medication Sig Dispense Refill  . acetaminophen (TYLENOL) 500 MG tablet Take 1,000 mg by mouth every 6 (six) hours as needed (headache).    Marland Kitchen levonorgestrel (MIRENA, 52 MG,) 20 MCG/24HR IUD 1 each by Intrauterine route once.    Marland Kitchen lisinopril (PRINIVIL,ZESTRIL) 20 MG tablet Take 1 tablet (20 mg total) by mouth daily. 90 tablet 1  . metFORMIN (GLUCOPHAGE) 500 MG tablet Take 1 tablet (500 mg total) by mouth daily with supper. 180 tablet 2  .  cyclobenzaprine (FLEXERIL) 10 MG tablet Take 1 tablet (10 mg total) by mouth 3 (three) times daily as needed for muscle spasms. (Patient not taking: Reported on 03/26/2018) 15 tablet 0  . SUMAtriptan (IMITREX) 100 MG tablet Take 1 tablet (100 mg total) by mouth every 2 (two) hours as needed for migraine. Do not exceed more than 200 mg in 24 hours (Patient not taking: Reported on 09/30/2018) 10 tablet 0  . traMADol (ULTRAM) 50 MG tablet Take 1 tablet (50 mg total) by mouth every 6 (six) hours as needed for moderate pain. (Patient not taking: Reported on 09/30/2018) 30 tablet 0   No facility-administered medications prior to visit.     No Known Allergies  ROS Review of Systems  Constitutional: Negative.   HENT: Negative.   Eyes: Negative.   Respiratory: Negative.     Cardiovascular: Negative.   Gastrointestinal: Negative.   Endocrine: Negative.   Genitourinary: Negative.   Musculoskeletal: Positive for arthralgias (Generalized) and myalgias (bilateral foot pain).  Skin: Negative.   Allergic/Immunologic: Negative.   Neurological: Negative.   Hematological: Negative.   Psychiatric/Behavioral: Negative.    Objective:    Physical Exam  Constitutional: She is oriented to person, place, and time. She appears well-developed and well-nourished.  HENT:  Head: Normocephalic and atraumatic.  Eyes: Conjunctivae are normal.  Neck: Normal range of motion. Neck supple.  Cardiovascular: Normal rate, regular rhythm, normal heart sounds and intact distal pulses.  Pulmonary/Chest: Effort normal and breath sounds normal.  Abdominal: Soft. Bowel sounds are normal. She exhibits distension (obese).  Musculoskeletal: Normal range of motion.  Neurological: She is alert and oriented to person, place, and time. She has normal reflexes.  Skin: Skin is warm and dry.  Psychiatric: She has a normal mood and affect. Her behavior is normal. Judgment and thought content normal.  Nursing note and vitals reviewed.   BP (!) 144/90   Pulse 64   Temp 97.8 F (36.6 C) (Oral)   Ht 5\' 5"  (1.651 m)   Wt (!) 344 lb (156 kg)   LMP 09/21/2018   SpO2 98%   BMI 57.24 kg/m  Wt Readings from Last 3 Encounters:  09/30/18 (!) 344 lb (156 kg)  05/27/18 (!) 331 lb 6.4 oz (150.3 kg)  03/26/18 (!) 334 lb (151.5 kg)    Health Maintenance Due  Topic Date Due  . HIV Screening  06/06/1996  . INFLUENZA VACCINE  03/21/2018   There are no preventive care reminders to display for this patient.  Lab Results  Component Value Date   TSH 0.94 12/19/2016   Lab Results  Component Value Date   WBC 7.5 07/23/2017   HGB 12.5 07/23/2017   HCT 39.1 07/23/2017   MCV 81.5 07/23/2017   PLT 290 07/23/2017   Lab Results  Component Value Date   NA 138 07/23/2017   K 3.8 07/23/2017   CO2 28  07/23/2017   GLUCOSE 100 (H) 07/23/2017   BUN 16 07/23/2017   CREATININE 0.94 07/23/2017   BILITOT 0.3 07/23/2017   ALKPHOS 58 07/23/2017   AST 13 (L) 07/23/2017   ALT 16 07/23/2017   PROT 7.5 07/23/2017   ALBUMIN 3.3 (L) 07/23/2017   CALCIUM 8.5 (L) 07/23/2017   ANIONGAP 7 07/23/2017   Lab Results  Component Value Date   CHOL 144 12/19/2016   Lab Results  Component Value Date   HDL 36 (L) 12/19/2016   Lab Results  Component Value Date   LDLCALC 89 12/19/2016   Lab  Results  Component Value Date   TRIG 97 12/19/2016   Lab Results  Component Value Date   CHOLHDL 4.0 12/19/2016   Lab Results  Component Value Date   HGBA1C 5.5 09/30/2018   Assessment & Plan:   1. Essential hypertension Blood pressure is stable at 144/90. Continue Lisinopril as prescribed. She will continue to decrease high sodium intake, excessive alcohol intake, increase potassium intake, smoking cessation, and increase physical activity of at least 30 minutes of cardio activity daily. She will continue to follow Heart Healthy or DASH diet.  2. Class 3 severe obesity due to excess calories with serious comorbidity and body mass index (BMI) of 50.0 to 59.9 in adult [facility]) Body mass index is 57.24 kg/m. Goal BMI  is <30. Encouraged efforts to reduce weight include engaging in physical activity as tolerated with goal of 150 minutes per week. Improve dietary choices and eat a meal regimen consistent with a Mediterranean or DASH diet. Reduce simple carbohydrates. Do not skip meals and eat healthy snacks throughout the day to avoid over-eating at dinner. Set a goal weight loss that is achievable for you.  3. Prediabetes Hgb A1c is stable at 5.5 today. She will continue to decrease foods/beverages high in sugars and carbs and follow Heart Healthy or DASH diet. Increase physical activity to at least 30 minutes cardio exercise daily.  - POCT glycosylated hemoglobin (Hb A1C) - POCT urinalysis dipstick  4.  Abnormal urinalysis Results are pending.  - Urine Culture  5.  Bilateral foot pain She will continue PT treatments as needed at Podiatry for foot pain.  6. Follow up She will follow up in 1 month to assess effectiveness of suggested weight loss strategies.   No orders of the defined types were placed in this encounter.  [name],  MSN, FNP-C [facility] [place], [place] [address] [phone]  Problem List Items Addressed This Visit    None    Visit Diagnoses    Essential hypertension    -  Primary   Class 3 severe obesity due to excess calories with serious comorbidity and body mass index (BMI) of 50.0 to 59.9 in adult [facility])       Prediabetes       Relevant Orders   POCT glycosylated hemoglobin (Hb A1C) (Completed)   POCT urinalysis dipstick (Completed)   Follow up       Abnormal urinalysis       Relevant Orders   Urine Culture      No orders of the defined types were placed in this encounter.   Follow-up: Return in about 1 month (around 10/29/2018).    Azzie Glatter, FNP

## 2018-10-01 ENCOUNTER — Encounter: Payer: Self-pay | Admitting: Family Medicine

## 2018-10-01 ENCOUNTER — Other Ambulatory Visit: Payer: Self-pay | Admitting: Family Medicine

## 2018-10-01 DIAGNOSIS — M79671 Pain in right foot: Secondary | ICD-10-CM | POA: Insufficient documentation

## 2018-10-01 DIAGNOSIS — M79672 Pain in left foot: Secondary | ICD-10-CM | POA: Insufficient documentation

## 2018-10-01 MED ORDER — SULFAMETHOXAZOLE-TRIMETHOPRIM 800-160 MG PO TABS: 1.0000 | ORAL_TABLET | Freq: Two times a day (BID) | ORAL | 0 refills | Status: DC

## 2018-10-02 ENCOUNTER — Telehealth: Payer: Self-pay

## 2018-10-02 NOTE — Telephone Encounter (Signed)
Patient would like to know what to do about her foot pain

## 2018-10-03 LAB — URINE CULTURE

## 2018-10-17 DIAGNOSIS — M7732 Calcaneal spur, left foot: Secondary | ICD-10-CM | POA: Diagnosis not present

## 2018-10-17 DIAGNOSIS — M7661 Achilles tendinitis, right leg: Secondary | ICD-10-CM | POA: Diagnosis not present

## 2018-10-17 DIAGNOSIS — M7662 Achilles tendinitis, left leg: Secondary | ICD-10-CM | POA: Diagnosis not present

## 2018-10-17 DIAGNOSIS — M7731 Calcaneal spur, right foot: Secondary | ICD-10-CM | POA: Diagnosis not present

## 2018-10-22 ENCOUNTER — Telehealth: Payer: Self-pay

## 2018-10-22 ENCOUNTER — Other Ambulatory Visit: Payer: Self-pay

## 2018-10-22 MED ORDER — SULFAMETHOXAZOLE-TRIMETHOPRIM 800-160 MG PO TABS: 1.0000 | ORAL_TABLET | Freq: Two times a day (BID) | ORAL | 0 refills | Status: AC

## 2018-10-22 NOTE — Telephone Encounter (Signed)
Patient was calling back regarding her foot pain.

## 2018-10-23 ENCOUNTER — Other Ambulatory Visit: Payer: Self-pay | Admitting: Family Medicine

## 2018-10-23 DIAGNOSIS — M79671 Pain in right foot: Secondary | ICD-10-CM

## 2018-10-23 MED ORDER — TRAMADOL HCL 50 MG PO TABS: 50.0000 mg | ORAL_TABLET | Freq: Four times a day (QID) | ORAL | 0 refills | Status: DC | PRN

## 2018-10-25 DIAGNOSIS — M71572 Other bursitis, not elsewhere classified, left ankle and foot: Secondary | ICD-10-CM | POA: Diagnosis not present

## 2018-10-25 DIAGNOSIS — M7662 Achilles tendinitis, left leg: Secondary | ICD-10-CM | POA: Diagnosis not present

## 2018-10-25 DIAGNOSIS — M71571 Other bursitis, not elsewhere classified, right ankle and foot: Secondary | ICD-10-CM | POA: Diagnosis not present

## 2018-10-25 DIAGNOSIS — M7661 Achilles tendinitis, right leg: Secondary | ICD-10-CM | POA: Diagnosis not present

## 2018-10-28 ENCOUNTER — Ambulatory Visit: Payer: Medicaid Other | Admitting: Family Medicine

## 2018-11-04 ENCOUNTER — Ambulatory Visit (INDEPENDENT_AMBULATORY_CARE_PROVIDER_SITE_OTHER): Payer: Self-pay | Admitting: Family Medicine

## 2018-11-04 ENCOUNTER — Encounter: Payer: Self-pay | Admitting: Family Medicine

## 2018-11-04 ENCOUNTER — Other Ambulatory Visit: Payer: Self-pay

## 2018-11-04 VITALS — BP 140/98 | HR 76 | Temp 98.2°F | Ht 65.0 in | Wt 351.0 lb

## 2018-11-04 DIAGNOSIS — M79671 Pain in right foot: Secondary | ICD-10-CM

## 2018-11-04 DIAGNOSIS — J302 Other seasonal allergic rhinitis: Secondary | ICD-10-CM | POA: Insufficient documentation

## 2018-11-04 DIAGNOSIS — N926 Irregular menstruation, unspecified: Secondary | ICD-10-CM | POA: Insufficient documentation

## 2018-11-04 DIAGNOSIS — Z09 Encounter for follow-up examination after completed treatment for conditions other than malignant neoplasm: Secondary | ICD-10-CM

## 2018-11-04 DIAGNOSIS — Z6841 Body Mass Index (BMI) 40.0 and over, adult: Secondary | ICD-10-CM | POA: Insufficient documentation

## 2018-11-04 DIAGNOSIS — M79672 Pain in left foot: Secondary | ICD-10-CM

## 2018-11-04 DIAGNOSIS — I10 Essential (primary) hypertension: Secondary | ICD-10-CM

## 2018-11-04 DIAGNOSIS — Z3202 Encounter for pregnancy test, result negative: Secondary | ICD-10-CM

## 2018-11-04 LAB — POCT URINE PREGNANCY: Preg Test, Ur: NEGATIVE

## 2018-11-04 LAB — POCT URINALYSIS DIP (MANUAL ENTRY)
Bilirubin, UA: NEGATIVE
Blood, UA: NEGATIVE
Glucose, UA: NEGATIVE mg/dL
Ketones, POC UA: NEGATIVE mg/dL
Leukocytes, UA: NEGATIVE
Nitrite, UA: NEGATIVE
Protein Ur, POC: NEGATIVE mg/dL
Spec Grav, UA: 1.025 (ref 1.010–1.025)
Urobilinogen, UA: 0.2 E.U./dL
pH, UA: 5.5 (ref 5.0–8.0)

## 2018-11-04 MED ORDER — TRAMADOL HCL 50 MG PO TABS: 50.0000 mg | ORAL_TABLET | Freq: Four times a day (QID) | ORAL | 0 refills | Status: DC | PRN

## 2018-11-04 MED ORDER — LISINOPRIL 20 MG PO TABS: 20.0000 mg | ORAL_TABLET | Freq: Every day | ORAL | 1 refills | Status: DC

## 2018-11-04 MED ORDER — CETIRIZINE HCL 10 MG PO TABS: 10.0000 mg | ORAL_TABLET | Freq: Every day | ORAL | 3 refills | Status: DC

## 2018-11-04 NOTE — Patient Instructions (Signed)
Cetirizine tablets What is this medicine? CETIRIZINE (se TI ra zeen) is an antihistamine. This medicine is used to treat or prevent symptoms of allergies. It is also used to help reduce itchy skin rash and hives. This medicine may be used for other purposes; ask your health care provider or pharmacist if you have questions. COMMON BRAND NAME(S): All Day Allergy, Allergy Relief, Zyrtec, Zyrtec Hives Relief What should I tell my health care provider before I take this medicine? They need to know if you have any of these conditions: -kidney disease -liver disease -an unusual or allergic reaction to cetirizine, hydroxyzine, other medicines, foods, dyes, or preservatives -pregnant or trying to get pregnant -breast-feeding How should I use this medicine? Take this medicine by mouth with a glass of water. Follow the directions on the prescription label. You can take this medicine with food or on an empty stomach. Take your medicine at regular times. Do not take more often than directed. You may need to take this medicine for several days before your symptoms improve. Talk to your pediatrician regarding the use of this medicine in children. Special care may be needed. While this drug may be prescribed for children as young as 75 years of age for selected conditions, precautions do apply. Overdosage: If you think you have taken too much of this medicine contact a poison control center or emergency room at once. NOTE: This medicine is only for you. Do not share this medicine with others. What if I miss a dose? If you miss a dose, take it as soon as you can. If it is almost time for your next dose, take only that dose. Do not take double or extra doses. What may interact with this medicine? -alcohol -certain medicines for anxiety or sleep -narcotic medicines for pain -other medicines for colds or allergies This list may not describe all possible interactions. Give your health care provider a list of all  the medicines, herbs, non-prescription drugs, or dietary supplements you use. Also tell them if you smoke, drink alcohol, or use illegal drugs. Some items may interact with your medicine. What should I watch for while using this medicine? Visit your doctor or health care professional for regular checks on your health. Tell your doctor if your symptoms do not improve. You may get drowsy or dizzy. Do not drive, use machinery, or do anything that needs mental alertness until you know how this medicine affects you. Do not stand or sit up quickly, especially if you are an older patient. This reduces the risk of dizzy or fainting spells. Your mouth may get dry. Chewing sugarless gum or sucking hard candy, and drinking plenty of water may help. Contact your doctor if the problem does not go away or is severe. What side effects may I notice from receiving this medicine? Side effects that you should report to your doctor or health care professional as soon as possible: -allergic reactions like skin rash, itching or hives, swelling of the face, lips, or tongue -changes in vision or hearing -fast or irregular heartbeat -trouble passing urine or change in the amount of urine Side effects that usually do not require medical attention (report to your doctor or health care professional if they continue or are bothersome): -dizziness -dry mouth -irritability -sore throat -stomach pain -tiredness This list may not describe all possible side effects. Call your doctor for medical advice about side effects. You may report side effects to FDA at 1-800-FDA-1088. Where should I keep my medicine? Keep  out of the reach of children. Store at room temperature between 15 and 30 degrees C (59 and 86 degrees F). Throw away any unused medicine after the expiration date. NOTE: This sheet is a summary. It may not cover all possible information. If you have questions about this medicine, talk to your doctor, pharmacist, or  health care provider.  2019 Elsevier/Gold Standard (2014-09-01 13:44:42) Allergies, Adult An allergy is when your body's defense system (immune system) overreacts to an otherwise harmless substance (allergen) that you breathe in or eat or something that touches your skin. When you come into contact with something that you are allergic to, your immune system produces certain proteins (antibodies). These proteins cause cells to release chemicals (histamines) that trigger the symptoms of an allergic reaction. Allergies often affect the nasal passages (allergic rhinitis), eyes (allergic conjunctivitis), skin (atopic dermatitis), and stomach. Allergies can be mild or severe. Allergies cannot spread from person to person (are not contagious). They can develop at any age and may be outgrown. What increases the risk? You may be at greater risk of allergies if other people in your family have allergies. What are the signs or symptoms? Symptoms depend on what type of allergy you have. They may include:  Runny, stuffy nose.  Sneezing.  Itchy mouth, ears, or throat.  Postnasal drip.  Sore throat.  Itchy, red, watery, or puffy eyes.  Skin rash or hives.  Stomach pain.  Vomiting.  Diarrhea.  Bloating.  Wheezing or coughing. People with a severe allergy to food, medicine, or an insect bite may have a life-threatening allergic reaction (anaphylaxis). Symptoms of anaphylaxis include:  Hives.  Itching.  Flushed face.  Swollen lips, tongue, or mouth.  Tight or swollen throat.  Chest pain or tightness in the chest.  Trouble breathing or shortness of breath.  Rapid heartbeat.  Dizziness or fainting.  Vomiting.  Diarrhea.  Pain in the abdomen. How is this diagnosed? This condition is diagnosed based on:  Your symptoms.  Your family and medical history.  A physical exam. You may need to see a health care provider who specializes in treating allergies (allergist). You may  also have tests, including:  Skin tests to see which allergens are causing your symptoms, such as: ? Skin prick test. In this test, your skin is pricked with a tiny needle and exposed to small amounts of possible allergens to see if your skin reacts. ? Intradermal skin test. In this test, a small amount of allergen is injected under your skin to see if your skin reacts. ? Patch test. In this test, a small amount of allergen is placed on your skin and then your skin is covered with a bandage. Your health care provider will check your skin after a couple of days to see if a rash has developed.  Blood tests.  Challenges tests. In this test, you inhale a small amount of allergen by mouth to see if you have an allergic reaction. You may also be asked to:  Keep a food diary. A food diary is a record of all the foods and drinks you have in a day and any symptoms you experience.  Practice an elimination diet. An elimination diet involves eliminating specific foods from your diet and then adding them back in one by one to find out if a certain food causes an allergic reaction. How is this treated? Treatment for allergies depends on your symptoms. Treatment may include:  Cold compresses to soothe itching and swelling.  Eye  drops.  Nasal sprays.  Using a saline spray or container (neti pot) to flush out the nose (nasal irrigation). These methods can help clear away mucus and keep the nasal passages moist.  Using a humidifier.  Oral antihistamines or other medicines to block allergic reaction and inflammation.  Skin creams to treat rashes or itching.  Diet changes to eliminate food allergy triggers.  Repeated exposure to tiny amounts of allergens to build up a tolerance and prevent future allergic reactions (immunotherapy). These include: ? Allergy shots. ? Oral treatment. This involves taking small doses of an allergen under the tongue (sublingual immunotherapy).  Emergency epinephrine  injection (auto-injector) in case of an allergic emergency. This is a self-injectable, pre-measured medicine that must be given within the first few minutes of a serious allergic reaction. Follow these instructions at home:         Avoid known allergens whenever possible.  If you suffer from airborne allergens, wash out your nose daily. You can do this with a saline spray or a neti pot to flush out your nose (nasal irrigation).  Take over-the-counter and prescription medicines only as told by your health care provider.  Keep all follow-up visits as told by your health care provider. This is important.  If you are at risk of a severe allergic reaction (anaphylaxis), keep your auto-injector with you at all times.  If you have ever had anaphylaxis, wear a medical alert bracelet or necklace that states you have a severe allergy. Contact a health care provider if:  Your symptoms do not improve with treatment. Get help right away if:  You have symptoms of anaphylaxis, such as: ? Swollen mouth, tongue, or throat. ? Pain or tightness in your chest. ? Trouble breathing or shortness of breath. ? Dizziness or fainting. ? Severe abdominal pain, vomiting, or diarrhea. This information is not intended to replace advice given to you by your health care provider. Make sure you discuss any questions you have with your health care provider. Document Released: 10/31/2002 Document Revised: 12/06/2016 Document Reviewed: 02/23/2016 Elsevier Interactive Patient Education  2019 [place] [facility] Many things can cause foot pain. Some common causes are:  An injury.  A sprain.  Arthritis.  Blisters.  Bunions. Follow these instructions at home: Pay attention to any changes in your symptoms. Take these actions to help with your discomfort:  If directed, put ice on the affected area: ? Put ice in a plastic bag. ? Place a towel between your skin and the bag. ? Leave the ice on for 15-20  minutes, 3?4 times a day for 2 days.  Take over-the-counter and prescription medicines only as told by your health care provider.  Wear comfortable, supportive shoes that fit you well. Do not wear high heels.  Do not stand or walk for long periods of time.  Do not lift a lot of weight. This can put added pressure on your feet.  Do stretches to relieve foot pain and stiffness as told by your health care provider.  Rub your foot gently.  Keep your feet clean and dry. Contact a health care provider if:  Your pain does not get better after a few days of self-care.  Your pain gets worse.  You cannot stand on your foot. Get help right away if:  Your foot is numb or tingling.  Your foot or toes are swollen.  Your foot or toes turn white or blue.  You have warmth and redness along your foot. This information  is not intended to replace advice given to you by your health care provider. Make sure you discuss any questions you have with your health care provider. Document Released: 09/03/2015 Document Revised: 01/13/2016 Document Reviewed: 09/02/2014 Elsevier Interactive Patient Education  2019 [employer] Tramadol tablets What is this medicine? TRAMADOL (TRA ma dole) is a pain reliever. It is used to treat moderate to severe pain in adults. This medicine may be used for other purposes; ask your health care provider or pharmacist if you have questions. COMMON BRAND NAME(S): Ultram What should I tell my health care provider before I take this medicine? They need to know if you have any of these conditions: -brain tumor -depression -drug abuse or addiction -head injury -if you frequently drink alcohol containing drinks -kidney disease or trouble passing urine -liver disease -lung disease, asthma, or breathing problems -seizures or epilepsy -suicidal thoughts, plans, or attempt; a previous suicide attempt by you or a family member -an unusual or allergic reaction to tramadol,  codeine, other medicines, foods, dyes, or preservatives -pregnant or trying to get pregnant -breast-feeding How should I use this medicine? Take this medicine by mouth with a full glass of water. Follow the directions on the prescription label. You can take it with or without food. If it upsets your stomach, take it with food. Do not take your medicine more often than directed. A special MedGuide will be given to you by the pharmacist with each prescription and refill. Be sure to read this information carefully each time. Talk to your pediatrician regarding the use of this medicine in children. Special care may be needed. Overdosage: If you think you have taken too much of this medicine contact a poison control center or emergency room at once. NOTE: This medicine is only for you. Do not share this medicine with others. What if I miss a dose? If you miss a dose, take it as soon as you can. If it is almost time for your next dose, take only that dose. Do not take double or extra doses. What may interact with this medicine? Do not take this medication with any of the following medicines: -MAOIs like Carbex, Eldepryl, Marplan, Nardil, and Parnate This medicine may also interact with the following medications: -alcohol -antihistamines for allergy, cough and cold -certain medicines for anxiety or sleep -certain medicines for depression like amitriptyline, fluoxetine, sertraline -certain medicines for migraine headache like almotriptan, eletriptan, frovatriptan, naratriptan, rizatriptan, sumatriptan, zolmitriptan -certain medicines for seizures like carbamazepine, oxcarbazepine, phenobarbital, primidone -certain medicines that treat or prevent blood clots like warfarin -digoxin -furazolidone -general anesthetics like halothane, isoflurane, methoxyflurane, propofol -linezolid -local anesthetics like lidocaine, pramoxine, tetracaine -medicines that relax muscles for surgery -other narcotic  medicines for pain or cough -phenothiazines like chlorpromazine, mesoridazine, prochlorperazine, thioridazine -procarbazine This list may not describe all possible interactions. Give your health care provider a list of all the medicines, herbs, non-prescription drugs, or dietary supplements you use. Also tell them if you smoke, drink alcohol, or use illegal drugs. Some items may interact with your medicine. What should I watch for while using this medicine? Tell your doctor or health care professional if your pain does not go away, if it gets worse, or if you have new or a different type of pain. You may develop tolerance to the medicine. Tolerance means that you will need a higher dose of the medicine for pain relief. Tolerance is normal and is expected if you take this medicine for a long time. Do not suddenly  stop taking your medicine because you may develop a severe reaction. Your body becomes used to the medicine. This does NOT mean you are addicted. Addiction is a behavior related to getting and using a drug for a non-medical reason. If you have pain, you have a medical reason to take pain medicine. Your doctor will tell you how much medicine to take. If your doctor wants you to stop the medicine, the dose will be slowly lowered over time to avoid any side effects. There are different types of narcotic medicines (opiates). If you take more than one type at the same time or if you are taking another medicine that also causes drowsiness, you may have more side effects. Give your health care provider a list of all medicines you use. Your doctor will tell you how much medicine to take. Do not take more medicine than directed. Call emergency for help if you have problems breathing or unusual sleepiness. You may get drowsy or dizzy. Do not drive, use machinery, or do anything that needs mental alertness until you know how this medicine affects you. Do not stand or sit up quickly, especially if you are an  older patient. This reduces the risk of dizzy or fainting spells. Alcohol can increase or decrease the effects of this medicine. Avoid alcoholic drinks. You may have constipation. Try to have a bowel movement at least every 2 to 3 days. If you do not have a bowel movement for 3 days, call your doctor or health care professional. Your mouth may get dry. Chewing sugarless gum or sucking hard candy, and drinking plenty of water may help. Contact your doctor if the problem does not go away or is severe. What side effects may I notice from receiving this medicine? Side effects that you should report to your doctor or health care professional as soon as possible: -allergic reactions like skin rash, itching or hives, swelling of the face, lips, or tongue -breathing problems -confusion -seizures -signs and symptoms of low blood pressure like dizziness; feeling faint or lightheaded, falls; unusually weak or tired -trouble passing urine or change in the amount of urine Side effects that usually do not require medical attention (report to your doctor or health care professional if they continue or are bothersome): -constipation -dry mouth -nausea, vomiting -tiredness This list may not describe all possible side effects. Call your doctor for medical advice about side effects. You may report side effects to FDA at 1-800-FDA-1088. Where should I keep my medicine? Keep out of the reach of children. This medicine may cause accidental overdose and death if it taken by other adults, children, or pets. Mix any unused medicine with a substance like cat litter or coffee grounds. Then throw the medicine away in a sealed container like a sealed bag or a coffee can with a lid. Do not use the medicine after the expiration date. Store at room temperature between 15 and 30 degrees C (59 and 86 degrees F). NOTE: This sheet is a summary. It may not cover all possible information. If you have questions about this medicine,  talk to your doctor, pharmacist, or health care provider.  2019 Elsevier/Gold Standard (2015-05-02 09:00:04)

## 2018-11-04 NOTE — Progress Notes (Signed)
Patient [facility]  Established Patient Office Visit  Subjective:  Patient ID: Chloe Castillo, female    DOB: [birth year]  Age: 38 y.o. MRN: [id number]  CC:  Chief Complaint  Patient presents with  . Follow-up    Chronic condition   . Menstrual Problem    HPI Chloe Castillo is a 38 year old familt who presents for follow up of chronic conditions today.   Past Medical History:  Diagnosis Date  . Hypertension   . Morbidly obese ([place])     Current Status: Since her last office visit, she continues to have c/o of bilateral foot problems. She did follow up with Podiatrist and is scheduled for right foot surgery on 03/22/2019. She denies visual changes, chest pain, cough, shortness of breath, heart palpitations, and falls. She has occasional headaches and dizziness with position changes. Denies severe headaches, confusion, seizures, double vision, and blurred vision, nausea and vomiting. She has c/o irregular periods. She has been on IUD for birth control for 4 years now. No reports of GI problems such as nausea, vomiting, diarrhea, and constipation. She has no reports of blood in stools, dysuria and hematuria. She has began to work out at [employer] with a co-worker.   She denies fevers, chills, fatigue, recent infections, weight loss, and night sweats. No depression or anxiety reported.    No past surgical history on file.  Family History  Problem Relation Age of Onset  . Hypertension Mother   . Hypertension Father     Social History   Socioeconomic History  . Marital status: Single    Spouse name: Not on file  . Number of children: Not on file  . Years of education: Not on file  . Highest education level: Not on file  Occupational History  . Not on file  Social Needs  . Financial resource strain: Not on file  . Food insecurity:    Worry: Not on file    Inability: Not on file  . Transportation needs:    Medical: Not on file   Non-medical: Not on file  Tobacco Use  . Smoking status: Never Smoker  . Smokeless tobacco: Never Used  Substance and Sexual Activity  . Alcohol use: No  . Drug use: No  . Sexual activity: Never  Lifestyle  . Physical activity:    Days per week: Not on file    Minutes per session: Not on file  . Stress: Not on file  Relationships  . Social connections:    Talks on phone: Not on file    Gets together: Not on file    Attends religious service: Not on file    Active member of club or organization: Not on file    Attends meetings of clubs or organizations: Not on file    Relationship status: Not on file  . Intimate partner violence:    Fear of current or ex partner: Not on file    Emotionally abused: Not on file    Physically abused: Not on file    Forced sexual activity: Not on file  Other Topics Concern  . Not on file  Social History Narrative  . Not on file    Outpatient Medications Prior to Visit  Medication Sig Dispense Refill  . acetaminophen (TYLENOL) 500 MG tablet Take 1,000 mg by mouth every 6 (six) hours as needed (headache).    . DUEXIS 800-26.6 MG TABS Take 1 tablet by mouth 2 (two)  times daily.    Marland Kitchen levonorgestrel (MIRENA, 52 MG,) 20 MCG/24HR IUD 1 each by Intrauterine route once.    . cyclobenzaprine (FLEXERIL) 10 MG tablet Take 1 tablet (10 mg total) by mouth 3 (three) times daily as needed for muscle spasms. (Patient not taking: Reported on 03/26/2018) 15 tablet 0  . metFORMIN (GLUCOPHAGE) 500 MG tablet Take 1 tablet (500 mg total) by mouth daily with supper. (Patient not taking: Reported on 11/04/2018) 180 tablet 2  . SUMAtriptan (IMITREX) 100 MG tablet Take 1 tablet (100 mg total) by mouth every 2 (two) hours as needed for migraine. Do not exceed more than 200 mg in 24 hours (Patient not taking: Reported on 09/30/2018) 10 tablet 0  . lisinopril (PRINIVIL,ZESTRIL) 20 MG tablet Take 1 tablet (20 mg total) by mouth daily. (Patient not taking: Reported on 11/04/2018) 90  tablet 1  . traMADol (ULTRAM) 50 MG tablet Take 1 tablet (50 mg total) by mouth every 6 (six) hours as needed for moderate pain. 30 tablet 0   No facility-administered medications prior to visit.     No Known Allergies  ROS Review of Systems  Constitutional: Negative.   HENT: Negative.   Eyes: Negative.   Respiratory: Negative.   Cardiovascular: Negative.   Gastrointestinal: Positive for abdominal distention (obese).  Endocrine: Negative.   Genitourinary: Positive for menstrual problem (irregular periods).  Musculoskeletal: Positive for arthralgias (bilateral foot pain).  Skin: Negative.   Allergic/Immunologic: Negative.   Neurological: Positive for dizziness and headaches.  Hematological: Negative.   Psychiatric/Behavioral: Negative.    Objective:    Physical Exam  Constitutional: She is oriented to person, place, and time. She appears well-developed and well-nourished.  HENT:  Head: Normocephalic and atraumatic.  Eyes: Conjunctivae are normal.  Neck: Normal range of motion. Neck supple.  Cardiovascular: Normal rate, regular rhythm, normal heart sounds and intact distal pulses.  Pulmonary/Chest: Effort normal and breath sounds normal.  Abdominal: Soft. Bowel sounds are normal. She exhibits distension (obese).  Musculoskeletal: Normal range of motion.  Neurological: She is alert and oriented to person, place, and time. She has normal reflexes.  Skin: Skin is warm and dry.  Psychiatric: She has a normal mood and affect. Her behavior is normal. Judgment and thought content normal.  Vitals reviewed.   BP (!) 140/98 (BP Location: Right Arm, Patient Position: Sitting, Cuff Size: Large)   Pulse 76   Temp 98.2 F (36.8 C) (Oral)   Ht 5\' 5"  (1.651 m)   Wt (!) 351 lb (159.2 kg)   LMP 09/25/2018   SpO2 99%   BMI 58.41 kg/m  Wt Readings from Last 3 Encounters:  11/04/18 (!) 351 lb (159.2 kg)  09/30/18 (!) 344 lb (156 kg)  05/27/18 (!) 331 lb 6.4 oz (150.3 kg)      Health Maintenance Due  Topic Date Due  . HIV Screening  06/06/1996  . INFLUENZA VACCINE  03/21/2018    There are no preventive care reminders to display for this patient.  Lab Results  Component Value Date   TSH 0.94 12/19/2016   Lab Results  Component Value Date   WBC 7.5 07/23/2017   HGB 12.5 07/23/2017   HCT 39.1 07/23/2017   MCV 81.5 07/23/2017   PLT 290 07/23/2017   Lab Results  Component Value Date   NA 138 07/23/2017   K 3.8 07/23/2017   CO2 28 07/23/2017   GLUCOSE 100 (H) 07/23/2017   BUN 16 07/23/2017   CREATININE 0.94 07/23/2017  BILITOT 0.3 07/23/2017   ALKPHOS 58 07/23/2017   AST 13 (L) 07/23/2017   ALT 16 07/23/2017   PROT 7.5 07/23/2017   ALBUMIN 3.3 (L) 07/23/2017   CALCIUM 8.5 (L) 07/23/2017   ANIONGAP 7 07/23/2017   Lab Results  Component Value Date   CHOL 144 12/19/2016   Lab Results  Component Value Date   HDL 36 (L) 12/19/2016   Lab Results  Component Value Date   LDLCALC 89 12/19/2016   Lab Results  Component Value Date   TRIG 97 12/19/2016   Lab Results  Component Value Date   CHOLHDL 4.0 12/19/2016   Lab Results  Component Value Date   HGBA1C 5.5 09/30/2018   Assessment & Plan:   1. Essential hypertension Blood pressure is elevated today. She has not been taking Lisinopril as prescribe. She will begin taking Lisinopril today. She will continue to decrease high sodium intake, excessive alcohol intake, increase potassium intake, smoking cessation, and increase physical activity of at least 30 minutes of cardio activity daily. She will continue to follow Heart Healthy or DASH diet. - POCT urinalysis dipstick - lisinopril (PRINIVIL,ZESTRIL) 20 MG tablet; Take 1 tablet (20 mg total) by mouth daily.  Dispense: 90 tablet; Refill: 1  2. Bilateral foot pain - traMADol (ULTRAM) 50 MG tablet; Take 1 tablet (50 mg total) by mouth every 6 (six) hours as needed for moderate pain.  Dispense: 30 tablet; Refill: 0  3. Foot pain, right  She is scheduled for right foot surgery 03/2019. - traMADol (ULTRAM) 50 MG tablet; Take 1 tablet (50 mg total) by mouth every 6 (six) hours as needed for moderate pain.  Dispense: 30 tablet; Refill: 0  4. BMI 50.0-59.9, adult ([facility]) Body mass index is 58.41 kg/m. Goal BMI  is <30. Encouraged efforts to reduce weight include engaging in physical activity as tolerated with goal of 150 minutes per week. Improve dietary choices and eat a meal regimen consistent with a Mediterranean or DASH diet. Reduce simple carbohydrates. Do not skip meals and eat healthy snacks throughout the day to avoid over-eating at dinner. Set a goal weight loss that is achievable for you.  5. Irregular periods/menstrual cycles - Ambulatory referral to Obstetrics / Gynecology - POCT urine pregnancy  6. Seasonal allergies We will initiate Zyrtec today.  - cetirizine (ZYRTEC) 10 MG tablet; Take 1 tablet (10 mg total) by mouth daily.  Dispense: 30 tablet; Refill: 3  7. Follow up She will follow up in 3 months.   Meds ordered this encounter  Medications  . lisinopril (PRINIVIL,ZESTRIL) 20 MG tablet    Sig: Take 1 tablet (20 mg total) by mouth daily.    Dispense:  90 tablet    Refill:  1  . cetirizine (ZYRTEC) 10 MG tablet    Sig: Take 1 tablet (10 mg total) by mouth daily.    Dispense:  30 tablet    Refill:  3  . traMADol (ULTRAM) 50 MG tablet    Sig: Take 1 tablet (50 mg total) by mouth every 6 (six) hours as needed for moderate pain.    Dispense:  30 tablet    Refill:  0    Order Specific Question:   Supervising Provider    Answer:   Tresa Garter [id number]    Orders Placed This Encounter  Procedures  . Ambulatory referral to Obstetrics / Gynecology  . POCT urinalysis dipstick  . POCT urine pregnancy    Referral Orders     Ambulatory  referral to Obstetrics / Gynecology   [name],  MSN, FNP-C Patient [place], [place] [address]  [phone]   Problem List Items Addressed This Visit      Other   Bilateral foot pain   Relevant Medications   traMADol (ULTRAM) 50 MG tablet    Other Visit Diagnoses    Essential hypertension    -  Primary   Relevant Medications   lisinopril (PRINIVIL,ZESTRIL) 20 MG tablet   Other Relevant Orders   POCT urinalysis dipstick (Completed)   Foot pain, right       Relevant Medications   traMADol (ULTRAM) 50 MG tablet   BMI 50.0-59.9, adult ([place])       Irregular periods/menstrual cycles       Relevant Orders   Ambulatory referral to Obstetrics / Gynecology   POCT urine pregnancy (Completed)   Seasonal allergies       Relevant Medications   cetirizine (ZYRTEC) 10 MG tablet   Follow up          Meds ordered this encounter  Medications  . lisinopril (PRINIVIL,ZESTRIL) 20 MG tablet    Sig: Take 1 tablet (20 mg total) by mouth daily.    Dispense:  90 tablet    Refill:  1  . cetirizine (ZYRTEC) 10 MG tablet    Sig: Take 1 tablet (10 mg total) by mouth daily.    Dispense:  30 tablet    Refill:  3  . traMADol (ULTRAM) 50 MG tablet    Sig: Take 1 tablet (50 mg total) by mouth every 6 (six) hours as needed for moderate pain.    Dispense:  30 tablet    Refill:  0    Order Specific Question:   Supervising Provider    Answer:   Tresa Garter [id number]    Follow-up: Return in about 3 months (around 02/04/2019).    Azzie Glatter, FNP

## 2018-11-22 ENCOUNTER — Other Ambulatory Visit: Payer: Self-pay

## 2018-11-22 ENCOUNTER — Other Ambulatory Visit: Payer: Self-pay | Admitting: Family Medicine

## 2018-11-22 ENCOUNTER — Other Ambulatory Visit: Payer: Medicaid Other

## 2018-11-22 ENCOUNTER — Telehealth: Payer: Self-pay

## 2018-11-22 DIAGNOSIS — Z3200 Encounter for pregnancy test, result unknown: Secondary | ICD-10-CM | POA: Diagnosis not present

## 2018-11-22 NOTE — Telephone Encounter (Signed)
Left a vm for patient to callback 

## 2018-11-22 NOTE — Telephone Encounter (Signed)
Patient coming in to office visit today

## 2018-11-23 LAB — HCG, SERUM, QUALITATIVE: hCG,Beta Subunit,Qual,Serum: NEGATIVE m[IU]/mL (ref <6)

## 2018-11-25 ENCOUNTER — Telehealth: Payer: Self-pay

## 2018-11-25 NOTE — Telephone Encounter (Signed)
Patient states that she is not feeling like herself and she is very fatigue. Patient states this is normally how she feels when she is pregnant. Blood test was negative. Please advise

## 2018-11-25 NOTE — Telephone Encounter (Signed)
Tried to contact patient no answer 

## 2018-11-26 NOTE — Telephone Encounter (Signed)
Patient advise and will callback in a week if she is still having symptoms.

## 2018-11-27 NOTE — Telephone Encounter (Signed)
Patient states that the abdominal pain and the nausea have gotten worse. I advise patient that she may need to go to hospital. Please advise.

## 2018-11-27 NOTE — Telephone Encounter (Signed)
Patient notified and states that she will go to hospital

## 2018-11-29 ENCOUNTER — Emergency Department (HOSPITAL_COMMUNITY): Payer: BLUE CROSS/BLUE SHIELD

## 2018-11-29 ENCOUNTER — Emergency Department (HOSPITAL_COMMUNITY)
Admission: EM | Admit: 2018-11-29 | Discharge: 2018-11-29 | Disposition: A | Discharge status: Home / Self Care | Payer: BLUE CROSS/BLUE SHIELD | Attending: Emergency Medicine | Admitting: Emergency Medicine

## 2018-11-29 ENCOUNTER — Encounter (HOSPITAL_COMMUNITY): Payer: Self-pay

## 2018-11-29 ENCOUNTER — Other Ambulatory Visit: Payer: Self-pay

## 2018-11-29 DIAGNOSIS — N760 Acute vaginitis: Secondary | ICD-10-CM | POA: Diagnosis not present

## 2018-11-29 DIAGNOSIS — Z79899 Other long term (current) drug therapy: Secondary | ICD-10-CM | POA: Diagnosis not present

## 2018-11-29 DIAGNOSIS — Z7984 Long term (current) use of oral hypoglycemic drugs: Secondary | ICD-10-CM | POA: Diagnosis not present

## 2018-11-29 DIAGNOSIS — Z113 Encounter for screening for infections with a predominantly sexual mode of transmission: Secondary | ICD-10-CM | POA: Diagnosis not present

## 2018-11-29 DIAGNOSIS — I10 Essential (primary) hypertension: Secondary | ICD-10-CM | POA: Diagnosis not present

## 2018-11-29 DIAGNOSIS — N730 Acute parametritis and pelvic cellulitis: Secondary | ICD-10-CM

## 2018-11-29 DIAGNOSIS — A599 Trichomoniasis, unspecified: Secondary | ICD-10-CM

## 2018-11-29 DIAGNOSIS — N739 Female pelvic inflammatory disease, unspecified: Secondary | ICD-10-CM | POA: Insufficient documentation

## 2018-11-29 DIAGNOSIS — B9689 Other specified bacterial agents as the cause of diseases classified elsewhere: Secondary | ICD-10-CM

## 2018-11-29 DIAGNOSIS — D259 Leiomyoma of uterus, unspecified: Secondary | ICD-10-CM | POA: Diagnosis not present

## 2018-11-29 DIAGNOSIS — R102 Pelvic and perineal pain: Secondary | ICD-10-CM | POA: Diagnosis present

## 2018-11-29 DIAGNOSIS — A5901 Trichomonal vulvovaginitis: Secondary | ICD-10-CM | POA: Diagnosis not present

## 2018-11-29 LAB — URINALYSIS, ROUTINE W REFLEX MICROSCOPIC
Bacteria, UA: NONE SEEN
Bilirubin Urine: NEGATIVE
Glucose, UA: NEGATIVE mg/dL
Hgb urine dipstick: NEGATIVE
Ketones, ur: NEGATIVE mg/dL
Nitrite: NEGATIVE
Protein, ur: NEGATIVE mg/dL
Specific Gravity, Urine: 1.019 (ref 1.005–1.030)
pH: 5 (ref 5.0–8.0)

## 2018-11-29 LAB — WET PREP, GENITAL
Sperm: NONE SEEN
Yeast Wet Prep HPF POC: NONE SEEN

## 2018-11-29 LAB — COMPREHENSIVE METABOLIC PANEL
ALT: 20 U/L (ref 0–44)
AST: 15 U/L (ref 15–41)
Albumin: 3.6 g/dL (ref 3.5–5.0)
Alkaline Phosphatase: 62 U/L (ref 38–126)
Anion gap: 8 (ref 5–15)
BUN: 15 mg/dL (ref 6–20)
CO2: 27 mmol/L (ref 22–32)
Calcium: 8.6 mg/dL — ABNORMAL LOW (ref 8.9–10.3)
Chloride: 104 mmol/L (ref 98–111)
Creatinine, Ser: 0.93 mg/dL (ref 0.44–1.00)
GFR calc Af Amer: >60 mL/min (ref >60)
GFR calc non Af Amer: >60 mL/min (ref >60)
Glucose, Bld: 116 mg/dL — ABNORMAL HIGH (ref 70–99)
Potassium: 3.6 mmol/L (ref 3.5–5.1)
Sodium: 139 mmol/L (ref 135–145)
Total Bilirubin: 0.5 mg/dL (ref 0.3–1.2)
Total Protein: 8.1 g/dL (ref 6.5–8.1)

## 2018-11-29 LAB — I-STAT BETA HCG BLOOD, ED (MC, WL, AP ONLY): I-stat hCG, quantitative: <5 m[IU]/mL (ref <5)

## 2018-11-29 LAB — CBC
HCT: 43 % (ref 36.0–46.0)
Hemoglobin: 13.4 g/dL (ref 12.0–15.0)
MCH: 26.5 pg (ref 26.0–34.0)
MCHC: 31.2 g/dL (ref 30.0–36.0)
MCV: 85 fL (ref 80.0–100.0)
Platelets: 284 10*3/uL (ref 150–400)
RBC: 5.06 MIL/uL (ref 3.87–5.11)
RDW: 13.9 % (ref 11.5–15.5)
WBC: 6.8 10*3/uL (ref 4.0–10.5)
nRBC: 0 % (ref 0.0–0.2)

## 2018-11-29 LAB — LIPASE, BLOOD: Lipase: 32 U/L (ref 11–51)

## 2018-11-29 MED ORDER — SODIUM CHLORIDE 0.9% FLUSH
3.0000 mL | Freq: Once | INTRAVENOUS | Status: AC
  Administered 2018-11-29: 3 mL via INTRAVENOUS

## 2018-11-29 MED ORDER — DOXYCYCLINE HYCLATE 100 MG PO CAPS: 100.0000 mg | ORAL_CAPSULE | Freq: Two times a day (BID) | ORAL | 0 refills | Status: DC

## 2018-11-29 MED ORDER — METRONIDAZOLE 500 MG PO TABS: 500.0000 mg | ORAL_TABLET | Freq: Two times a day (BID) | ORAL | 0 refills | Status: DC

## 2018-11-29 MED ORDER — CEFTRIAXONE SODIUM 250 MG IJ SOLR
250.0000 mg | Freq: Once | INTRAMUSCULAR | Status: AC
  Administered 2018-11-29: 250 mg via INTRAMUSCULAR
  Filled 2018-11-29: qty 250

## 2018-11-29 MED ORDER — KETOROLAC TROMETHAMINE 15 MG/ML IJ SOLN
15.0000 mg | Freq: Once | INTRAMUSCULAR | Status: AC
  Administered 2018-11-29: 15 mg via INTRAVENOUS
  Filled 2018-11-29: qty 1

## 2018-11-29 MED ORDER — NAPROXEN 500 MG PO TABS: 500.0000 mg | ORAL_TABLET | Freq: Two times a day (BID) | ORAL | 0 refills | Status: DC

## 2018-11-29 NOTE — Discharge Instructions (Signed)
You were seen in the ER today for abdominal pain.  Your labs overall looked similar to prior.  Your ultrasound showed your IUD in place and a small uterine fibroid.  Your wet prep (pelvic exam sample) showed trichomonas which is an STD as well as BV- this is not an STD.  We are treating you for possible pelvic inflammatory disease (PID) which can be seen with pelvic pain and an STD.   We gave you rocephin here and are sending you home with flagyl & doxycycline for 2 weeks. Do not drink alcohol with these medicines as it is extremely dangerous.  We are also sending you home with naproxen for pain.   - Naproxen is a nonsteroidal anti-inflammatory medication that will help with pain and swelling. Be sure to take this medication as prescribed with food, 1 pill every 12 hours,  It should be taken with food, as it can cause stomach upset, and more seriously, stomach bleeding. Do not take other nonsteroidal anti-inflammatory medications with this such as Advil, Motrin, Aleve, Mobic, Goodie Powder, or Motrin.    We have prescribed you new medication(s) today. Discuss the medications prescribed today with your pharmacist as they can have adverse effects and interactions with your other medicines including over the counter and prescribed medications. Seek medical evaluation if you start to experience new or abnormal symptoms after taking one of these medicines, seek care immediately if you start to experience difficulty breathing, feeling of your throat closing, facial swelling, or rash as these could be indications of a more serious allergic reaction  Abstain from intercourse for 2 weeks to prevent spread of infection or re-infection.  Inform all sexual partners.   Follow up with obgyn within 1 week for re-evaluation and possible removal of your IUD return to the emergency department for new or worsening symptoms including but not limited to worsening abdominal pain, inability to keep fluids down, fever,  chills, or any other concerns.

## 2018-11-29 NOTE — ED Triage Notes (Signed)
Patient c/o mid lower abdominal pain and dysuria x 3-4 days ago. Patient denies any vaginal drainage.

## 2018-11-29 NOTE — ED Provider Notes (Signed)
[facility] DEPT Provider Note   CSN: [id number] Arrival date & time: 11/29/18  1117    History   Chief Complaint Chief Complaint  Patient presents with   Abdominal Pain    HPI Chloe Castillo is a 38 y.o. female with a hx of HTN & obesity who presents to the ED with complaints of abdominal pain for the past 3-4 days. Patient notes pain is in the suprapubic area, constant, & most significant just prior to and with urination. Pain becomes 10/10. Associated urinary urgency & mild nausea. Has had irregular periods for past several months w/ negative pregnancy test. Occasionally sexually active w/ 1 partner, no concern for STD. Denies fever, chills, emesis, diarrhea, melena, hematochezia, vaginal bleeding, or vaginal discharge. Has IUD in place.     HPI  Past Medical History:  Diagnosis Date   Hypertension    Morbidly obese [facility] : [facility])     Patient Active Problem List   Diagnosis Date Noted   BMI 50.0-59.9, adult ([place]) 11/04/2018   Irregular periods/menstrual cycles 11/04/2018   Seasonal allergies 11/04/2018   Bilateral foot pain 10/01/2018   Pre-diabetes 05/06/2018   Elevated blood pressure reading without diagnosis of hypertension 12/24/2016   Hx of migraine headaches 12/24/2016   Class 3 severe obesity due to excess calories with serious comorbidity and body mass index (BMI) of 50.0 to 59.9 in adult [facility]) 12/24/2016    History reviewed. No pertinent surgical history.   OB History   No obstetric history on file.      Home Medications    Prior to Admission medications   Medication Sig Start Date End Date Taking? Authorizing Provider  acetaminophen (TYLENOL) 500 MG tablet Take 1,000 mg by mouth every 6 (six) hours as needed (headache).   Yes [provider]  cetirizine (ZYRTEC) 10 MG tablet Take 1 tablet (10 mg total) by mouth daily. 11/04/18  Yes Azzie Glatter, FNP  cyclobenzaprine (FLEXERIL) 10 MG tablet Take 1 tablet (10 mg  total) by mouth 3 (three) times daily as needed for muscle spasms. 01/31/17  Yes Street, [place], PA-C  DUEXIS 800-26.6 MG TABS Take 1 tablet by mouth 2 (two) times daily. 10/18/18  Yes [provider]  levonorgestrel (MIRENA, 52 MG,) 20 MCG/24HR IUD 1 each by Intrauterine route once.   Yes [provider]  lisinopril (PRINIVIL,ZESTRIL) 20 MG tablet Take 1 tablet (20 mg total) by mouth daily. 11/04/18  Yes Azzie Glatter, FNP  metFORMIN (GLUCOPHAGE) 500 MG tablet Take 1 tablet (500 mg total) by mouth daily with supper. 03/26/18  Yes Azzie Glatter, FNP  SUMAtriptan (IMITREX) 100 MG tablet Take 1 tablet (100 mg total) by mouth every 2 (two) hours as needed for migraine. Do not exceed more than 200 mg in 24 hours 12/19/16  Yes Scot Jun, FNP  traMADol (ULTRAM) 50 MG tablet Take 1 tablet (50 mg total) by mouth every 6 (six) hours as needed for moderate pain. 11/04/18  Yes Azzie Glatter, FNP    Family History Family History  Problem Relation Age of Onset   Hypertension Mother    Hypertension Father     Social History Social History   Tobacco Use   Smoking status: Never Smoker   Smokeless tobacco: Never Used  Substance Use Topics   Alcohol use: No   Drug use: No     Allergies   Patient has no known allergies.   Review of Systems Review of Systems  Constitutional: Negative for  chills and fever.  Respiratory: Negative for shortness of breath.   Cardiovascular: Negative for chest pain.  Gastrointestinal: Positive for abdominal pain and nausea. Negative for blood in stool, constipation, diarrhea and vomiting.  Genitourinary: Positive for urgency. Negative for dysuria, vaginal bleeding and vaginal discharge.  All other systems reviewed and are negative.   Physical Exam Updated Vital Signs BP (!) 167/116 (BP Location: Left Arm)    Pulse 96    Temp 98.3 F (36.8 C) (Oral)    Resp 16    Ht 5\' 4"  (1.626 m)    Wt 136.1 kg    LMP 10/15/2018 Comment:  Patient has irregular periods   SpO2 97%    BMI 51.49 kg/m   Physical Exam Vitals signs and nursing note reviewed. Exam conducted with a chaperone present.  Constitutional:      General: She is not in acute distress.    Appearance: She is well-developed. She is not toxic-appearing.  HENT:     Head: Normocephalic and atraumatic.  Eyes:     General:        Right eye: No discharge.        Left eye: No discharge.     Conjunctiva/sclera: Conjunctivae normal.  Neck:     Musculoskeletal: Neck supple.  Cardiovascular:     Rate and Rhythm: Normal rate and regular rhythm.  Pulmonary:     Effort: Pulmonary effort is normal. No respiratory distress.     Breath sounds: Normal breath sounds. No wheezing, rhonchi or rales.  Abdominal:     General: There is no distension.     Palpations: Abdomen is soft.     Tenderness: There is abdominal tenderness (mild) in the suprapubic area. There is no right CVA tenderness, left CVA tenderness, guarding or rebound. Negative signs include Murphy's sign and McBurney's sign.  Genitourinary:    Exam position: Supine.     Labia:        Right: No lesion.        Left: No lesion.      Vagina: Vaginal discharge (mild clear to white) present. No erythema or bleeding.     Adnexa:        Right: No mass.         Left: No mass.       Comments: Diffuse uncomfortable, no focal tenderness or CMT Exam limited secondary to body habitus, difficulty visualizing the cervix.   Hopkins, Nancy Nordmann, [place] present as chaperone.  Skin:    General: Skin is warm and dry.     Findings: No rash.  Neurological:     Mental Status: She is alert.     Comments: Clear speech.   Psychiatric:        Behavior: Behavior normal.    ED Treatments / Results  Labs (all labs ordered are listed, but only abnormal results are displayed) Labs Reviewed  WET PREP, GENITAL - Abnormal; Notable for the following components:      Result Value   Trich, Wet Prep PRESENT (*)    Clue Cells Wet Prep  HPF POC PRESENT (*)    WBC, Wet Prep HPF POC MANY (*)    All other components within normal limits  COMPREHENSIVE METABOLIC PANEL - Abnormal; Notable for the following components:   Glucose, Bld 116 (*)    Calcium 8.6 (*)    All other components within normal limits  URINALYSIS, ROUTINE W REFLEX MICROSCOPIC - Abnormal; Notable for the following components:   APPearance HAZY (*)  Leukocytes,Ua MODERATE (*)    All other components within normal limits  URINE CULTURE  LIPASE, BLOOD  CBC  I-STAT BETA HCG BLOOD, ED ([facility], [facility], [facility] ONLY)  GC/CHLAMYDIA PROBE AMP ([place]) NOT AT [facility]    EKG None  Radiology US Transvaginal Non-ob  Result Date: 11/29/2018 CLINICAL DATA:  Pelvic pain EXAM: TRANSABDOMINAL AND TRANSVAGINAL ULTRASOUND OF PELVIS DOPPLER ULTRASOUND OF OVARIES TECHNIQUE: Both transabdominal and transvaginal ultrasound examinations of the pelvis were performed. Transabdominal technique was performed for global imaging of the pelvis including uterus, ovaries, adnexal regions, and pelvic cul-de-sac. It was necessary to proceed with endovaginal exam following the transabdominal exam to visualize the uterus, endometrium, ovaries and adnexa. Color and duplex Doppler ultrasound was utilized to evaluate blood flow to the ovaries. COMPARISON:  None. FINDINGS: Uterus Measurements: 13 x 5.3 x 5.1 cm = volume: 190 mL. Anterior left intramural fibroid measures 2.4 cm Endometrium Thickness: 4 mm in thickness. No focal abnormality visualized. IUD appears to be in the lower uterine segment and cervical region. Right ovary Measurements: 2.9 x 1.7 x 1.8 cm = volume: 4.6 mL. Normal appearance/no adnexal mass. Left ovary Measurements: 2.9 x 2.3 x 2.3 cm = volume: 8.1 mL. Normal appearance/no adnexal mass. Pulsed Doppler evaluation of both ovaries demonstrates normal low-resistance arterial and venous waveforms. Other findings No free fluid.  Study is limited due to body habitus. IMPRESSION: IUD noted in the  lower uterine segment/cervical region. Small anterior uterine fibroid. No acute findings. Electronically Signed   By: Rolm Baptise M.D.   On: 11/29/2018 16:28   US Pelvis Complete  Result Date: 11/29/2018 CLINICAL DATA:  Pelvic pain EXAM: TRANSABDOMINAL AND TRANSVAGINAL ULTRASOUND OF PELVIS DOPPLER ULTRASOUND OF OVARIES TECHNIQUE: Both transabdominal and transvaginal ultrasound examinations of the pelvis were performed. Transabdominal technique was performed for global imaging of the pelvis including uterus, ovaries, adnexal regions, and pelvic cul-de-sac. It was necessary to proceed with endovaginal exam following the transabdominal exam to visualize the uterus, endometrium, ovaries and adnexa. Color and duplex Doppler ultrasound was utilized to evaluate blood flow to the ovaries. COMPARISON:  None. FINDINGS: Uterus Measurements: 13 x 5.3 x 5.1 cm = volume: 190 mL. Anterior left intramural fibroid measures 2.4 cm Endometrium Thickness: 4 mm in thickness. No focal abnormality visualized. IUD appears to be in the lower uterine segment and cervical region. Right ovary Measurements: 2.9 x 1.7 x 1.8 cm = volume: 4.6 mL. Normal appearance/no adnexal mass. Left ovary Measurements: 2.9 x 2.3 x 2.3 cm = volume: 8.1 mL. Normal appearance/no adnexal mass. Pulsed Doppler evaluation of both ovaries demonstrates normal low-resistance arterial and venous waveforms. Other findings No free fluid.  Study is limited due to body habitus. IMPRESSION: IUD noted in the lower uterine segment/cervical region. Small anterior uterine fibroid. No acute findings. Electronically Signed   By: Rolm Baptise M.D.   On: 11/29/2018 16:28   [place] Art/ven Flow Abd Pelv Doppler  Result Date: 11/29/2018 CLINICAL DATA:  Pelvic pain EXAM: TRANSABDOMINAL AND TRANSVAGINAL ULTRASOUND OF PELVIS DOPPLER ULTRASOUND OF OVARIES TECHNIQUE: Both transabdominal and transvaginal ultrasound examinations of the pelvis were performed. Transabdominal technique was  performed for global imaging of the pelvis including uterus, ovaries, adnexal regions, and pelvic cul-de-sac. It was necessary to proceed with endovaginal exam following the transabdominal exam to visualize the uterus, endometrium, ovaries and adnexa. Color and duplex Doppler ultrasound was utilized to evaluate blood flow to the ovaries. COMPARISON:  None. FINDINGS: Uterus Measurements: 13 x 5.3 x 5.1 cm = volume:  190 mL. Anterior left intramural fibroid measures 2.4 cm Endometrium Thickness: 4 mm in thickness. No focal abnormality visualized. IUD appears to be in the lower uterine segment and cervical region. Right ovary Measurements: 2.9 x 1.7 x 1.8 cm = volume: 4.6 mL. Normal appearance/no adnexal mass. Left ovary Measurements: 2.9 x 2.3 x 2.3 cm = volume: 8.1 mL. Normal appearance/no adnexal mass. Pulsed Doppler evaluation of both ovaries demonstrates normal low-resistance arterial and venous waveforms. Other findings No free fluid.  Study is limited due to body habitus. IMPRESSION: IUD noted in the lower uterine segment/cervical region. Small anterior uterine fibroid. No acute findings. Electronically Signed   By: Rolm Baptise M.D.   On: 11/29/2018 16:28    Procedures Procedures (including critical care time)  Medications Ordered in ED Medications  cefTRIAXone (ROCEPHIN) injection 250 mg (has no administration in time range)  sodium chloride flush (NS) 0.9 % injection 3 mL (3 mLs Intravenous Given 11/29/18 1138)  ketorolac (TORADOL) 15 MG/ML injection 15 mg (15 mg Intravenous Given 11/29/18 1242)     Initial Impression / Assessment and Plan / ED Course  I have reviewed the triage vital signs and the nursing notes.  Pertinent labs & imaging results that were available during my care of the patient were reviewed by me and considered in my medical decision making (see chart for details).    Patient presents to the ED with complaints of abdominal pain. Patient nontoxic appearing, in no apparent  distress, vitals WNL other than elevated BP- doubt HTN emergency, aware of need for PCP follow up. On exam patient tender to the suprapbuic area mildly, no peritoneal signs, diffusely uncomfortable throughout pelvic, but no focal tenderness or CMT. Pelvic exam limited by body habitus, difficulty visualizing cervix/IUD strings, mild discharge noted in vaginal vault. Will evaluate with labs & [place]. Toradol ordered   ER work-up reviewed:  CBC: No leukocytosis or anemia.  HWE:XHBZ hypocalcemia @ 8.6. Mild hyperglycemia @ 116.  Lipase: WNL UA:  Moderate leukocytes, nitrite negative, no bacteria, urine culture added on, but will hold off on UTI tx right now.  Preg test: Negative GC/chlamydia/HIV/RPR pending.  Wet prep: BV & Trich. No yeast.  Imaging IUD noted in the lower uterine segment/cervical region. Small anterior uterine fibroid. No acute findings.   Abdominal pain seems pelvic in nature, no peritoneal signs, negative murphy/smcburney, doubt appendicitis, cholecystitis, pancreatitis, perf/obstruction, or ectopic pregnancy.   Concern for PID given + for trich w/ pelvic pain. Not septic appearing, no leukocytosis or fever, does not appear to require hospitalization at this time. No TOA on [place]. Did consider removal of IUD given STD, but given difficulty with pelvic exam do not feel this would be appropriate in the ER will defer to OBGYN follow up for decision and management of IUD. Will tx w/ rocephin in the ED w/ discharge home w/ flagyl/doxy, naproxen for pain, discussed no EtOH with flagyl. Discussed need to inform all sexual partners, abstinence for 2 weeks, and protection following abstinence period.  OBGYN follow up.  I discussed results, treatment plan, need for follow-up, and return precautions with the patient. Provided opportunity for questions, patient confirmed understanding and is in agreement with plan.   Findings and plan of care discussed with supervising physician Dr. Darl Householder who is in  agreement.    Final Clinical Impressions(s) / ED Diagnoses   Final diagnoses:  Trichomonas infection  Bacterial vaginosis  PID (acute pelvic inflammatory disease)    ED Discharge Orders  Ordered    metroNIDAZOLE (FLAGYL) 500 MG tablet  2 times daily     11/29/18 1644    doxycycline (VIBRAMYCIN) 100 MG capsule  2 times daily     11/29/18 1644    naproxen (NAPROSYN) 500 MG tablet  2 times daily     11/29/18 1644           Khadija Thier, [place] R, PA-C 11/29/18 1653    Drenda Freeze, MD 12/03/18 [phone]

## 2018-11-29 NOTE — ED Notes (Addendum)
US at bedside

## 2018-11-29 NOTE — ED Notes (Signed)
Pelvic materials are set up at bedside.

## 2018-11-29 NOTE — ED Notes (Signed)
Patient given discharge teaching and verbalized understanding. Patient ambulated out of ED with a steady gait. 

## 2018-11-30 LAB — URINE CULTURE: Culture: NO GROWTH

## 2018-11-30 LAB — RPR: RPR Ser Ql: NONREACTIVE

## 2018-11-30 LAB — HIV ANTIBODY (ROUTINE TESTING W REFLEX): HIV Screen 4th Generation wRfx: NONREACTIVE

## 2018-12-02 LAB — GC/CHLAMYDIA PROBE AMP (~~LOC~~) NOT AT ARMC
Chlamydia: NEGATIVE
Neisseria Gonorrhea: NEGATIVE

## 2018-12-10 ENCOUNTER — Other Ambulatory Visit: Payer: Self-pay

## 2018-12-10 ENCOUNTER — Ambulatory Visit (INDEPENDENT_AMBULATORY_CARE_PROVIDER_SITE_OTHER): Payer: BLUE CROSS/BLUE SHIELD | Admitting: Family Medicine

## 2018-12-10 DIAGNOSIS — I10 Essential (primary) hypertension: Secondary | ICD-10-CM | POA: Diagnosis not present

## 2018-12-10 DIAGNOSIS — Z6841 Body Mass Index (BMI) 40.0 and over, adult: Secondary | ICD-10-CM | POA: Diagnosis not present

## 2018-12-10 DIAGNOSIS — E66813 Obesity, class 3: Secondary | ICD-10-CM

## 2018-12-10 DIAGNOSIS — R110 Nausea: Secondary | ICD-10-CM

## 2018-12-10 NOTE — Progress Notes (Signed)
Virtual Visit via Telephone Note  I connected with Chloe Castillo on 12/10/18 at 10:00 AM EDT by telephone and verified that I am speaking with the correct person using two identifiers.   I discussed the limitations, risks, security and privacy concerns of performing an evaluation and management service by telephone and the availability of in person appointments. I also discussed with the patient that there may be a patient responsible charge related to this service. The patient expressed understanding and agreed to proceed.   History of Present Illness:  Past Medical History:  Diagnosis Date  . Hypertension   . Morbidly obese ([place])     Current Outpatient Medications on File Prior to Visit  Medication Sig Dispense Refill  . acetaminophen (TYLENOL) 500 MG tablet Take 1,000 mg by mouth every 6 (six) hours as needed (headache).    . cetirizine (ZYRTEC) 10 MG tablet Take 1 tablet (10 mg total) by mouth daily. 30 tablet 3  . cyclobenzaprine (FLEXERIL) 10 MG tablet Take 1 tablet (10 mg total) by mouth 3 (three) times daily as needed for muscle spasms. 15 tablet 0  . doxycycline (VIBRAMYCIN) 100 MG capsule Take 1 capsule (100 mg total) by mouth 2 (two) times daily. 28 capsule 0  . DUEXIS 800-26.6 MG TABS Take 1 tablet by mouth 2 (two) times daily.    Marland Kitchen levonorgestrel (MIRENA, 52 MG,) 20 MCG/24HR IUD 1 each by Intrauterine route once.    Marland Kitchen lisinopril (PRINIVIL,ZESTRIL) 20 MG tablet Take 1 tablet (20 mg total) by mouth daily. 90 tablet 1  . metFORMIN (GLUCOPHAGE) 500 MG tablet Take 1 tablet (500 mg total) by mouth daily with supper. 180 tablet 2  . metroNIDAZOLE (FLAGYL) 500 MG tablet Take 1 tablet (500 mg total) by mouth 2 (two) times daily. 28 tablet 0  . naproxen (NAPROSYN) 500 MG tablet Take 1 tablet (500 mg total) by mouth 2 (two) times daily. 10 tablet 0  . SUMAtriptan (IMITREX) 100 MG tablet Take 1 tablet (100 mg total) by mouth every 2 (two) hours as needed for migraine. Do not exceed more  than 200 mg in 24 hours 10 tablet 0  . traMADol (ULTRAM) 50 MG tablet Take 1 tablet (50 mg total) by mouth every 6 (six) hours as needed for moderate pain. 30 tablet 0   No current facility-administered medications on file prior to visit.     Current Status: Since her last ED visit for PID, she continues to have nausea, which she is not taking any medication for symptoms. Her last menstrual period with 10/07/2018. She has appointment with OB-GYN for 01/07/2019. No reports of GI problems such as vomiting, diarrhea, and constipation. She has no reports of blood in stools, dysuria and hematuria.   She denies fevers, chills, fatigue, recent infections, weight loss, and night sweats. She has not had any headaches, visual changes, dizziness, and falls. No chest pain, heart palpitations, cough and shortness of breath reported. No depression or anxiety reported. She denies pain today.    Observations/Objective:  Telephone Virtual Visit.   Assessment and Plan:  1. Nausea Stable today.   2. Essential hypertension Continue Lisinopril as prescribed. She will continue to decrease high sodium intake, excessive alcohol intake, increase potassium intake, smoking cessation, and increase physical activity of at least 30 minutes of cardio activity daily. She will continue to follow Heart Healthy or DASH diet.  3. Class 3 severe obesity due to excess calories with serious comorbidity and body mass index (BMI) of 50.0 to  59.9 in adult [facility]) Goal BMI  is <30. Encouraged efforts to reduce weight include engaging in physical activity as tolerated with goal of 150 minutes per week. Improve dietary choices and eat a meal regimen consistent with a Mediterranean or DASH diet. Reduce simple carbohydrates. Do not skip meals and eat healthy snacks throughout the day to avoid over-eating at dinner. Set a goal weight loss that is achievable for you.  No orders of the defined types were placed in this encounter.  No  orders of the defined types were placed in this encounter.   Referral Orders  No referral(s) requested today    Chloe Becton,  MSN, FNP-C [facility] [place] [address] [place], [place] [address] [phone]   Follow Up Instructions:  She will keep follow up appointment for IUD removal on 12/25/2018.     I discussed the assessment and treatment plan with the patient. The patient was provided an opportunity to ask questions and all were answered. The patient agreed with the plan and demonstrated an understanding of the instructions.   The patient was advised to call back or seek an in-person evaluation if the symptoms worsen or if the condition fails to improve as anticipated.  I provided 15-20 minutes of non-face-to-face time during this encounter.   Azzie Glatter, FNP

## 2018-12-25 ENCOUNTER — Ambulatory Visit: Payer: Medicaid Other | Admitting: Family Medicine

## 2019-01-01 ENCOUNTER — Encounter: Payer: Self-pay | Admitting: Family Medicine

## 2019-01-01 ENCOUNTER — Ambulatory Visit (INDEPENDENT_AMBULATORY_CARE_PROVIDER_SITE_OTHER): Payer: BLUE CROSS/BLUE SHIELD | Admitting: Family Medicine

## 2019-01-01 ENCOUNTER — Other Ambulatory Visit: Payer: Self-pay

## 2019-01-01 VITALS — BP 140/90 | HR 84 | Temp 98.7°F | Ht 64.0 in | Wt 355.0 lb

## 2019-01-01 DIAGNOSIS — N926 Irregular menstruation, unspecified: Secondary | ICD-10-CM

## 2019-01-01 DIAGNOSIS — E66813 Obesity, class 3: Secondary | ICD-10-CM

## 2019-01-01 DIAGNOSIS — Z09 Encounter for follow-up examination after completed treatment for conditions other than malignant neoplasm: Secondary | ICD-10-CM

## 2019-01-01 DIAGNOSIS — Z30432 Encounter for removal of intrauterine contraceptive device: Secondary | ICD-10-CM | POA: Diagnosis not present

## 2019-01-01 DIAGNOSIS — D219 Benign neoplasm of connective and other soft tissue, unspecified: Secondary | ICD-10-CM

## 2019-01-01 MED ORDER — NORETHIN ACE-ETH ESTRAD-FE 1-20 MG-MCG PO TABS: 1.0000 | ORAL_TABLET | Freq: Every day | ORAL | 11 refills | Status: DC

## 2019-01-01 NOTE — Progress Notes (Signed)
[name]   Established Patient Office Visit  Subjective:  Patient ID: Chloe Castillo, female    DOB: 28-Dec-1980  Age: 38 y.o. MRN: [id number]  CC:  Chief Complaint  Patient presents with  . IUD Removal    HPI Chloe Castillo is a 38 year old female who presents for IUD removal today.   Past Medical History:  Diagnosis Date  . Hypertension   . Morbidly obese ([place])     Current Status: Since her last office visit, she is doing well with no complaints. Last menstrual period was 09/2018. She has a follow up appointment with GYN on 01/06/2019 for irregular bleeding. Denies nausea, vomiting, diarrhea, and constipation. She has no reports of blood in stools, dysuria and hematuria. She is here today to have IUD removed today.   She denies fevers, chills, fatigue, recent infections, weight loss, and night sweats. She has not had any headaches, visual changes, dizziness, and falls. No chest pain, heart palpitations, cough and shortness of breath reported. No depression or anxiety reported. She denies pain today.   History reviewed. No pertinent surgical history.  Family History  Problem Relation Age of Onset  . Hypertension Mother   . Hypertension Father     Social History   Socioeconomic History  . Marital status: Single    Spouse name: Not on file  . Number of children: Not on file  . Years of education: Not on file  . Highest education level: Not on file  Occupational History  . Not on file  Social Needs  . Financial resource strain: Not on file  . Food insecurity:    Worry: Not on file    Inability: Not on file  . Transportation needs:    Medical: Not on file    Non-medical: Not on file  Tobacco Use  . Smoking status: Never Smoker  . Smokeless tobacco: Never Used  Substance and Sexual Activity  . Alcohol use: No  . Drug use: No  . Sexual activity: Never  Lifestyle  . Physical activity:    Days per week: Not on file     Minutes per session: Not on file  . Stress: Not on file  Relationships  . Social connections:    Talks on phone: Not on file    Gets together: Not on file    Attends religious service: Not on file    Active member of club or organization: Not on file    Attends meetings of clubs or organizations: Not on file    Relationship status: Not on file  . Intimate partner violence:    Fear of current or ex partner: Not on file    Emotionally abused: Not on file    Physically abused: Not on file    Forced sexual activity: Not on file  Other Topics Concern  . Not on file  Social History Narrative  . Not on file    Outpatient Medications Prior to Visit  Medication Sig Dispense Refill  . acetaminophen (TYLENOL) 500 MG tablet Take 1,000 mg by mouth every 6 (six) hours as needed (headache).    . cetirizine (ZYRTEC) 10 MG tablet Take 1 tablet (10 mg total) by mouth daily. 30 tablet 3  . cyclobenzaprine (FLEXERIL) 10 MG tablet Take 1 tablet (10 mg total) by mouth 3 (three) times daily as needed for muscle spasms. 15 tablet 0  . DUEXIS 800-26.6 MG TABS Take 1 tablet by mouth 2 (  two) times daily.    Marland Kitchen levonorgestrel (MIRENA, 52 MG,) 20 MCG/24HR IUD 1 each by Intrauterine route once.    Marland Kitchen lisinopril (PRINIVIL,ZESTRIL) 20 MG tablet Take 1 tablet (20 mg total) by mouth daily. 90 tablet 1  . metFORMIN (GLUCOPHAGE) 500 MG tablet Take 1 tablet (500 mg total) by mouth daily with supper. 180 tablet 2  . naproxen (NAPROSYN) 500 MG tablet Take 1 tablet (500 mg total) by mouth 2 (two) times daily. 10 tablet 0  . SUMAtriptan (IMITREX) 100 MG tablet Take 1 tablet (100 mg total) by mouth every 2 (two) hours as needed for migraine. Do not exceed more than 200 mg in 24 hours 10 tablet 0   No facility-administered medications prior to visit.     No Known Allergies  ROS Review of Systems  Constitutional: Negative.   HENT: Negative.   Eyes: Negative.   Respiratory: Negative.   Gastrointestinal: Positive for  abdominal distention (obese).       Irregular menstrual periods  Endocrine: Negative.   Genitourinary: Negative.   Musculoskeletal: Negative.   Skin: Negative.   Allergic/Immunologic: Negative.   Neurological: Negative.   Hematological: Negative.   Psychiatric/Behavioral: Negative.       Objective:    Physical Exam  Constitutional: She is oriented to person, place, and time. She appears well-developed and well-nourished.  HENT:  Head: Normocephalic and atraumatic.  Eyes: Conjunctivae are normal.  Neck: Normal range of motion. Neck supple.  Cardiovascular: Normal rate, regular rhythm, normal heart sounds and intact distal pulses.  Pulmonary/Chest: Effort normal and breath sounds normal.  Abdominal: Soft. Bowel sounds are normal.  Musculoskeletal: Normal range of motion.  Neurological: She is alert and oriented to person, place, and time. She has normal reflexes.  Skin: Skin is warm and dry.  Psychiatric: She has a normal mood and affect. Her behavior is normal. Judgment and thought content normal.  Nursing note and vitals reviewed.   BP 140/90 (BP Location: Left Arm, Patient Position: Sitting, Cuff Size: Large)   Pulse 84   Temp 98.7 F (37.1 C) (Oral)   Ht 5\' 4"  (1.626 m)   Wt (!) 355 lb (161 kg)   LMP 10/03/2018   SpO2 100%   BMI 60.94 kg/m  Wt Readings from Last 3 Encounters:  01/01/19 (!) 355 lb (161 kg)  11/29/18 300 lb (136.1 kg)  11/04/18 (!) 351 lb (159.2 kg)     There are no preventive care reminders to display for this patient.  There are no preventive care reminders to display for this patient.  Lab Results  Component Value Date   TSH 0.94 12/19/2016   Lab Results  Component Value Date   WBC 6.8 11/29/2018   HGB 13.4 11/29/2018   HCT 43.0 11/29/2018   MCV 85.0 11/29/2018   PLT 284 11/29/2018   Lab Results  Component Value Date   NA 139 11/29/2018   K 3.6 11/29/2018   CO2 27 11/29/2018   GLUCOSE 116 (H) 11/29/2018   BUN 15 11/29/2018    CREATININE 0.93 11/29/2018   BILITOT 0.5 11/29/2018   ALKPHOS 62 11/29/2018   AST 15 11/29/2018   ALT 20 11/29/2018   PROT 8.1 11/29/2018   ALBUMIN 3.6 11/29/2018   CALCIUM 8.6 (L) 11/29/2018   ANIONGAP 8 11/29/2018   Lab Results  Component Value Date   CHOL 144 12/19/2016   Lab Results  Component Value Date   HDL 36 (L) 12/19/2016   Lab Results  Component Value  Date   LDLCALC 89 12/19/2016   Lab Results  Component Value Date   TRIG 97 12/19/2016   Lab Results  Component Value Date   CHOLHDL 4.0 12/19/2016   Lab Results  Component Value Date   HGBA1C 5.5 09/30/2018   Assessment & Plan:   1. Encounter for IUD removal IUD removed today. Unable to obtain portion of IUD from uterus, possibly r/t fibroids. We will recommend [place] at GYN appointment for further assessment. Patient tolerated procedure well with no complaints. We will initiate birth control pills.   - norethindrone-ethinyl estradiol (LOESTRIN FE) 1-20 MG-MCG tablet; Take 1 tablet by mouth daily.  Dispense: 1 Package; Refill: 11     2. Irregular periods/menstrual cycles She will keep follow up appointment with GYN on 01/06/2019 for assessment of irregular menstrual cycles.   3. Class 3 severe obesity due to excess calories with serious comorbidity and body mass index (BMI) of 60.0 to 69.9 in adult [facility]) Body mass index is 60.94 kg/m. Goal BMI  is <30. Encouraged efforts to reduce weight include engaging in physical activity as tolerated with goal of 150 minutes per week. Improve dietary choices and eat a meal regimen consistent with a Mediterranean or DASH diet. Reduce simple carbohydrates. Do not skip meals and eat healthy snacks throughout the day to avoid over-eating at dinner. Set a goal weight loss that is achievable for you.  4. Follow up Keep follow up appointment.   Meds ordered this encounter  Medications  . norethindrone-ethinyl estradiol (LOESTRIN FE) 1-20 MG-MCG tablet    Sig: Take 1 tablet by  mouth daily.    Dispense:  1 Package    Refill:  11    No orders of the defined types were placed in this encounter.   Referral Orders  No referral(s) requested today    [name],  MSN, FNP-C [facility] [place], [place] [address] [phone]   Problem List Items Addressed This Visit      Other   Class 3 severe obesity due to excess calories with serious comorbidity and body mass index (BMI) of 50.0 to 59.9 in adult ([place])   Irregular periods/menstrual cycles    Other Visit Diagnoses    Encounter for IUD removal    -  Primary   Relevant Medications   norethindrone-ethinyl estradiol (LOESTRIN FE) 1-20 MG-MCG tablet   Follow up          Meds ordered this encounter  Medications  . norethindrone-ethinyl estradiol (LOESTRIN FE) 1-20 MG-MCG tablet    Sig: Take 1 tablet by mouth daily.    Dispense:  1 Package    Refill:  11    Follow-up: No follow-ups on file.    Azzie Glatter, FNP

## 2019-01-01 NOTE — Patient Instructions (Signed)
Oral Contraception Information Oral contraceptive pills (OCPs) are medicines taken to prevent pregnancy. OCPs are taken by mouth, and they work by:  Preventing the ovaries from releasing eggs.  Thickening mucus in the lower part of the uterus (cervix), which prevents sperm from entering the uterus.  Thinning the lining of the uterus (endometrium), which prevents a fertilized egg from attaching to the endometrium. OCPs are highly effective when taken exactly as prescribed. However, OCPs do not prevent STIs (sexually transmitted infections). Safe sex practices, such as using condoms while on an OCP, can help prevent STIs. Before starting OCPs Before you start taking OCPs, you may have a physical exam, blood test, and Pap test. However, you are not required to have a pelvic exam in order to be prescribed OCPs. Your health care provider will make sure you are a good candidate for oral contraception. OCPs are not a good option for certain women, including women who smoke and are older than 35 years, and women with a medical history of high blood pressure, deep vein thrombosis, pulmonary embolism, stroke, cardiovascular disease, or peripheral vascular disease. Discuss with your health care provider the possible side effects of the OCP you may be prescribed. When you start an OCP, be aware that it can take 2-3 months for your body to adjust to changes in hormone levels. Follow instructions from your health care provider about how to start taking your first cycle of OCPs. Depending on when you start the pill, you may need to use a backup form of birth control, such as condoms, during the first week. Make sure you know what steps to take if you ever forget to take the pill. Types of oral contraception  The most common types of birth control pills contain the hormones estrogen and progestin (synthetic progesterone) or progestin only. The combination pill This type of pill contains estrogen and progestin  hormones. Combination pills often come in packs of 21, 28, or 91 pills. For each pack, the last 7 pills may not contain hormones, which means you may stop taking the pills for 7 days. Menstrual bleeding occurs during the week that you do not take the pills or that you take the pills with no hormones in them. The minipill This type of pill contains the progestin hormone only. It comes in packs of 28 pills. All 28 pills contain the hormone. You take the pill every day. It is very important to take the pill at the same time each day. Advantages of oral contraceptive pills  Provides reliable and continuous contraception if taken as instructed.  May treat or decrease symptoms of: ? Menstrual period cramps. ? Irregular menstrual cycle or bleeding. ? Heavy menstrual flow. ? Abnormal uterine bleeding. ? Acne, depending on the type of pill. ? Polycystic ovarian syndrome. ? Endometriosis. ? Iron deficiency anemia. ? Premenstrual symptoms, including premenstrual dysphoric disorder.  May reduce the risk of endometrial and ovarian cancer.  Can be used as emergency contraception.  Prevents mislocated (ectopic) pregnancies and infections of the fallopian tubes. Things that can make oral contraceptive pills less effective OCPs can be less effective if:  You forget to take the pill at the same time every day. This is especially important when taking the minipill.  You have a stomach or intestinal disease that reduces your body's ability to absorb the pill.  You take OCPs with other medicines that make OCPs less effective, such as antibiotics, certain HIV medicines, and some seizure medicines.  You take expired OCPs.    You forget to restart the pill on day 7, if using the packs of 21 pills. Risks associated with oral contraceptive pills Oral contraceptive pills can sometimes cause side effects, such as:  Headache.  Depression.  Trouble sleeping.  Nausea and vomiting.  Breast tenderness.   Irregular bleeding or spotting during the first several months.  Bloating or fluid retention.  Increase in blood pressure. Combination pills are also associated with a small increase in the risk of:  Blood clots.  Heart attack.  Stroke. Summary  Oral contraceptive pills are medicines taken by mouth to prevent pregnancy. They are highly effective when taken exactly as prescribed.  The most common types of birth control pills contain the hormones estrogen and progestin (synthetic progesterone) or progestin only.  Before you start taking the pill, you may have a physical exam, blood test, and Pap test. Your health care provider will make sure you are a good candidate for oral contraception.  The combination pill may come in a 21-day pack, a 28-day pack, or a 91-day pack. The minipill contains the progesterone hormone only and comes in packs of 28 pills.  Oral contraceptive pills can sometimes cause side effects, such as headache, nausea, breast tenderness, or irregular bleeding. This information is not intended to replace advice given to you by your health care provider. Make sure you discuss any questions you have with your health care provider. Document Released: 10/28/2002 Document Revised: 10/31/2016 Document Reviewed: 10/31/2016 Elsevier Interactive Patient Education  2019 Elsevier Inc. Ethinyl Estradiol; Norethindrone Acetate; Ferrous fumarate tablets or capsules What is this medicine? ETHINYL ESTRADIOL; NORETHINDRONE ACETATE; FERROUS FUMARATE (ETH in il es tra DYE ole; nor eth IN drone AS e tate; FER [place] FUE ma rate) is an oral contraceptive. The products combine two types of female hormones, an estrogen and a progestin. They are used to prevent ovulation and pregnancy. Some products are also used to treat acne in females. This medicine may be used for other purposes; ask your health care provider or pharmacist if you have questions. COMMON BRAND NAME(S): Aurovela [address] 1/20,  Aurovela Fe, [place] [address] Fe, [place] Fe, Gildess 24 Fe, Gildess Fe 1.5/30, Gildess Fe 1/20, Hailey 24 Fe, Junel Fe 1.5/30, Junel Fe 1/20, Junel Fe 24, Larin Fe, Lo Loestrin Fe, Loestrin 24 Fe, Loestrin FE 1.5/30, Loestrin FE 1/20, Lom[place] 24 Fe, Microgestin 24 Fe, Microgestin Fe 1.5/30, Microgestin Fe 1/20, Tarina 24 Fe, Tarina Fe 1/20, [place], Tilia Fe, Tri-Legest Fe What should I tell my health care provider before I take this medicine? They need to know if you have any of these conditions: -abnormal vaginal bleeding -blood vessel disease -breast, cervical, endometrial, ovarian, liver, or uterine cancer -diabetes -gallbladder disease -heart disease or recent heart attack -high blood pressure -high cholesterol -history of blood clots -kidney disease -liver disease -migraine headaches -smoke tobacco -stroke -systemic lupus erythematosus (SLE) -an unusual or allergic reaction to estrogens, progestins, other medicines, foods, dyes, or preservatives -pregnant or trying to get pregnant -breast-feeding How should I use this medicine? Take this medicine by mouth. To reduce nausea, this medicine may be taken with food. Follow the directions on the prescription label. Take this medicine at the same time each day and in the order directed on the package. Do not take your medicine more often than directed. A patient package insert for the product will be given with each prescription and refill. Read this sheet carefully each time. The sheet may change frequently. Contact your pediatrician regarding the  use of this medicine in children. Special care may be needed. This medicine has been used in female children who have started having menstrual periods. Overdosage: If you think you have taken too much of this medicine contact a poison control center or emergency room at once. NOTE: This medicine is only for you. Do not share this medicine with others. What if I miss a dose? If you miss  a dose, refer to the patient information sheet you received with your medicine for direction. If you miss more than one pill, this medicine may not be as effective and you may need to use another form of birth control. What may interact with this medicine? Do not take this medicine with the following medication: -dasabuvir; ombitasvir; paritaprevir; ritonavir -ombitasvir; paritaprevir; ritonavir This medicine may also interact with the following medications: -acetaminophen -antibiotics or medicines for infections, especially rifampin, rifabutin, rifapentine, and griseofulvin, and possibly penicillins or tetracyclines -aprepitant -ascorbic acid (vitamin C) -atorvastatin -barbiturate medicines, such as phenobarbital -bosentan -carbamazepine -caffeine -clofibrate -cyclosporine -dantrolene -doxercalciferol -felbamate -grapefruit juice -hydrocortisone -medicines for anxiety or sleeping problems, such as diazepam or temazepam -medicines for diabetes, including pioglitazone -mineral oil -modafinil -mycophenolate -nefazodone -oxcarbazepine -phenytoin -prednisolone -ritonavir or other medicines for HIV infection or AIDS -rosuvastatin -selegiline -soy isoflavones supplements -St. John's wort -tamoxifen or raloxifene -theophylline -thyroid hormones -topiramate -warfarin This list may not describe all possible interactions. Give your health care provider a list of all the medicines, herbs, non-prescription drugs, or dietary supplements you use. Also tell them if you smoke, drink alcohol, or use illegal drugs. Some items may interact with your medicine. What should I watch for while using this medicine? Visit your doctor or health care professional for regular checks on your progress. You will need a regular breast and pelvic exam and Pap smear while on this medicine. Use an additional method of contraception during the first cycle that you take these tablets. If you have any reason  to think you are pregnant, stop taking this medicine right away and contact your doctor or health care professional. If you are taking this medicine for hormone related problems, it may take several cycles of use to see improvement in your condition. Smoking increases the risk of getting a blood clot or having a stroke while you are taking birth control pills, especially if you are more than 38 years old. You are strongly advised not to smoke. This medicine can make your body retain fluid, making your fingers, hands, or ankles swell. Your blood pressure can go up. Contact your doctor or health care professional if you feel you are retaining fluid. This medicine can make you more sensitive to the sun. Keep out of the sun. If you cannot avoid being in the sun, wear protective clothing and use sunscreen. Do not use sun lamps or tanning beds/booths. If you wear contact lenses and notice visual changes, or if the lenses begin to feel uncomfortable, consult your eye care specialist. In some women, tenderness, swelling, or minor bleeding of the gums may occur. Notify your dentist if this happens. Brushing and flossing your teeth regularly may help limit this. See your dentist regularly and inform your dentist of the medicines you are taking. If you are going to have elective surgery, you may need to stop taking this medicine before the surgery. Consult your health care professional for advice. This medicine does not protect you against HIV infection (AIDS) or any other sexually transmitted diseases. What side effects may I notice from receiving  this medicine? Side effects that you should report to your doctor or health care professional as soon as possible: -allergic reactions like skin rash, itching or hives, swelling of the face, lips, or tongue -breast tissue changes or discharge -changes in vaginal bleeding during your period or between your periods -changes in vision -chest pain -confusion -coughing  up blood -dizziness -feeling faint or lightheaded -headaches or migraines -leg, arm or groin pain -loss of balance or coordination -severe or sudden headaches -stomach pain (severe) -sudden shortness of breath -sudden numbness or weakness of the face, arm or leg -symptoms of vaginal infection like itching, irritation or unusual discharge -tenderness in the upper abdomen -trouble speaking or understanding -vomiting -yellowing of the eyes or skin Side effects that usually do not require medical attention (report to your doctor or health care professional if they continue or are bothersome): -breakthrough bleeding and spotting that continues beyond the 3 initial cycles of pills -breast tenderness -mood changes, anxiety, depression, frustration, anger, or emotional outbursts -increased sensitivity to sun or ultraviolet light -nausea -skin rash, acne, or brown spots on the skin -weight gain (slight) This list may not describe all possible side effects. Call your doctor for medical advice about side effects. You may report side effects to FDA at 1-800-FDA-1088. Where should I keep my medicine? Keep out of the reach of children. Store at room temperature between 15 and 30 degrees C (59 and 86 degrees F). Throw away any unused medicine after the expiration date. NOTE: This sheet is a summary. It may not cover all possible information. If you have questions about this medicine, talk to your doctor, pharmacist, or health care provider.  2019 Elsevier/Gold Standard (2016-04-17 08:04:41)

## 2019-01-02 ENCOUNTER — Encounter: Payer: Self-pay | Admitting: Family Medicine

## 2019-01-02 DIAGNOSIS — D219 Benign neoplasm of connective and other soft tissue, unspecified: Secondary | ICD-10-CM | POA: Insufficient documentation

## 2019-01-07 DIAGNOSIS — N926 Irregular menstruation, unspecified: Secondary | ICD-10-CM | POA: Diagnosis not present

## 2019-01-07 DIAGNOSIS — Z189 Retained foreign body fragments, unspecified material: Secondary | ICD-10-CM | POA: Diagnosis not present

## 2019-01-07 DIAGNOSIS — Z6841 Body Mass Index (BMI) 40.0 and over, adult: Secondary | ICD-10-CM | POA: Diagnosis not present

## 2019-01-15 ENCOUNTER — Encounter (HOSPITAL_COMMUNITY): Payer: Self-pay

## 2019-01-22 ENCOUNTER — Encounter (HOSPITAL_COMMUNITY): Payer: Self-pay | Admitting: Obstetrics & Gynecology

## 2019-01-27 DIAGNOSIS — M6701 Short Achilles tendon (acquired), right ankle: Secondary | ICD-10-CM | POA: Diagnosis not present

## 2019-01-27 DIAGNOSIS — Z79899 Other long term (current) drug therapy: Secondary | ICD-10-CM | POA: Diagnosis not present

## 2019-01-27 DIAGNOSIS — Z01812 Encounter for preprocedural laboratory examination: Secondary | ICD-10-CM | POA: Diagnosis not present

## 2019-01-27 DIAGNOSIS — E119 Type 2 diabetes mellitus without complications: Secondary | ICD-10-CM | POA: Diagnosis not present

## 2019-01-27 DIAGNOSIS — M7662 Achilles tendinitis, left leg: Secondary | ICD-10-CM | POA: Diagnosis not present

## 2019-02-03 ENCOUNTER — Ambulatory Visit: Payer: Medicaid Other | Admitting: Family Medicine

## 2019-02-28 DIAGNOSIS — M25561 Pain in right knee: Secondary | ICD-10-CM | POA: Diagnosis not present

## 2019-03-05 DIAGNOSIS — Z20828 Contact with and (suspected) exposure to other viral communicable diseases: Secondary | ICD-10-CM | POA: Diagnosis not present

## 2019-05-30 DIAGNOSIS — M25571 Pain in right ankle and joints of right foot: Secondary | ICD-10-CM | POA: Diagnosis not present

## 2019-05-30 DIAGNOSIS — M7731 Calcaneal spur, right foot: Secondary | ICD-10-CM | POA: Diagnosis not present

## 2019-06-30 DIAGNOSIS — Z6841 Body Mass Index (BMI) 40.0 and over, adult: Secondary | ICD-10-CM | POA: Diagnosis not present

## 2019-07-05 ENCOUNTER — Encounter (HOSPITAL_COMMUNITY): Payer: Self-pay | Admitting: *Deleted

## 2019-07-05 ENCOUNTER — Emergency Department (HOSPITAL_COMMUNITY)
Admission: EM | Admit: 2019-07-05 | Discharge: 2019-07-05 | Disposition: A | Discharge status: Home / Self Care | Payer: BC Managed Care – PPO | Attending: Emergency Medicine | Admitting: Emergency Medicine

## 2019-07-05 ENCOUNTER — Other Ambulatory Visit: Payer: Self-pay

## 2019-07-05 DIAGNOSIS — Z5321 Procedure and treatment not carried out due to patient leaving prior to being seen by health care provider: Secondary | ICD-10-CM | POA: Insufficient documentation

## 2019-07-05 DIAGNOSIS — R519 Headache, unspecified: Secondary | ICD-10-CM | POA: Diagnosis present

## 2019-07-05 NOTE — ED Notes (Signed)
Pt called for room. Pt did not answer and security members saw pt exiting the facility.

## 2019-07-05 NOTE — ED Triage Notes (Addendum)
Pt states she has a migraine that started yesterday. No N/V or photophobia reported, Pt busy texting in triage

## 2019-07-31 DIAGNOSIS — M71571 Other bursitis, not elsewhere classified, right ankle and foot: Secondary | ICD-10-CM | POA: Diagnosis not present

## 2019-07-31 DIAGNOSIS — M7661 Achilles tendinitis, right leg: Secondary | ICD-10-CM | POA: Diagnosis not present

## 2019-08-12 ENCOUNTER — Ambulatory Visit
Admission: EM | Admit: 2019-08-12 | Discharge: 2019-08-12 | Disposition: A | Discharge status: Home / Self Care | Payer: BC Managed Care – PPO | Attending: Physician Assistant | Admitting: Physician Assistant

## 2019-08-12 DIAGNOSIS — Z20828 Contact with and (suspected) exposure to other viral communicable diseases: Secondary | ICD-10-CM

## 2019-08-12 DIAGNOSIS — R0602 Shortness of breath: Secondary | ICD-10-CM | POA: Diagnosis not present

## 2019-08-12 DIAGNOSIS — J4521 Mild intermittent asthma with (acute) exacerbation: Secondary | ICD-10-CM | POA: Diagnosis not present

## 2019-08-12 MED ORDER — AEROCHAMBER PLUS FLO-VU MEDIUM MISC
1.0000 | Freq: Once | Status: AC
  Administered 2019-08-12: 1

## 2019-08-12 MED ORDER — BENZONATATE 200 MG PO CAPS: 200.0000 mg | ORAL_CAPSULE | Freq: Three times a day (TID) | ORAL | 0 refills | Status: DC

## 2019-08-12 MED ORDER — ALBUTEROL SULFATE HFA 108 (90 BASE) MCG/ACT IN AERS
2.0000 | INHALATION_SPRAY | Freq: Once | RESPIRATORY_TRACT | Status: AC
  Administered 2019-08-12: 18:00:00 2 via RESPIRATORY_TRACT

## 2019-08-12 MED ORDER — DEXAMETHASONE SODIUM PHOSPHATE 10 MG/ML IJ SOLN
10.0000 mg | Freq: Once | INTRAMUSCULAR | Status: AC
  Administered 2019-08-12: 10 mg via INTRAMUSCULAR

## 2019-08-12 NOTE — Discharge Instructions (Signed)
COVID PCR testing ordered. I would like you to quarantine until testing results. Albuterol 1-2 puffs every 4-6 hours as needed. Tessalon as needed for cough. You can take over the counter flonase/nasacort to help with nasal congestion/drainage. If experiencing worsening shortness of breath, trouble breathing, go to the emergency department for further evaluation needed.

## 2019-08-12 NOTE — ED Triage Notes (Signed)
Pt c/o cough, chest congestion, and chest pain on inspiration. Denies SOB, states some wheezing. O2 sat 90-92% ra

## 2019-08-12 NOTE — ED Provider Notes (Signed)
[facility]    CSN: [id number] Arrival date & time: 08/12/19  1647      History   Chief Complaint Chief Complaint  Patient presents with  . Cough    HPI Chloe Castillo is a 38 y.o. female.   38 year old female comes in for 6 day of URI symptoms. Nonproductive cough, chills, body aches. Pleuritic chest pain. Denies rhinorrhea, nasal congestion, sore throat. Denies fever. Has had some nausea after coughing fit. Denies abdominal pain, vomiting, diarrhea. Denies shortness of breath, loss of taste/smell. Never smoker. No history of asthma, inhaler use. Mucinex without much relief.     Past Medical History:  Diagnosis Date  . Fibroids   . Hypertension   . Morbidly obese [facility])     Patient Active Problem List   Diagnosis Date Noted  . Fibroids 01/02/2019  . BMI 50.0-59.9, adult ([place]) 11/04/2018  . Irregular periods/menstrual cycles 11/04/2018  . Seasonal allergies 11/04/2018  . Bilateral foot pain 10/01/2018  . Pre-diabetes 05/06/2018  . Elevated blood pressure reading without diagnosis of hypertension 12/24/2016  . Hx of migraine headaches 12/24/2016  . Class 3 severe obesity due to excess calories with serious comorbidity and body mass index (BMI) of 50.0 to 59.9 in adult [facility]) 12/24/2016    History reviewed. No pertinent surgical history.  OB History   No obstetric history on file.      Home Medications    Prior to Admission medications   Medication Sig Start Date End Date Taking? Authorizing Provider  acetaminophen (TYLENOL) 500 MG tablet Take 1,000 mg by mouth every 6 (six) hours as needed (headache).    [provider]  benzonatate (TESSALON) 200 MG capsule Take 1 capsule (200 mg total) by mouth every 8 (eight) hours. 08/12/19   Tasia Catchings, Rosalin Buster V, PA-C  cetirizine (ZYRTEC) 10 MG tablet Take 1 tablet (10 mg total) by mouth daily. 11/04/18   Azzie Glatter, FNP  cyclobenzaprine (FLEXERIL) 10 MG tablet Take 1 tablet (10 mg total) by mouth 3 (three)  times daily as needed for muscle spasms. 01/31/17   Street, Mercedes, PA-C  lisinopril (PRINIVIL,ZESTRIL) 20 MG tablet Take 1 tablet (20 mg total) by mouth daily. 11/04/18   Azzie Glatter, FNP  naproxen (NAPROSYN) 500 MG tablet Take 1 tablet (500 mg total) by mouth 2 (two) times daily. 11/29/18   Petrucelli, Glynda Jaeger, PA-C    Family History Family History  Problem Relation Age of Onset  . Hypertension Mother   . Hypertension Father     Social History Social History   Tobacco Use  . Smoking status: Never Smoker  . Smokeless tobacco: Never Used  Substance Use Topics  . Alcohol use: No  . Drug use: No     Allergies   Patient has no known allergies.   Review of Systems Review of Systems  Reason unable to perform ROS: See HPI as above.     Physical Exam Triage Vital Signs ED Triage Vitals  Enc Vitals Group     BP 08/12/19 1710 (!) 154/103     Pulse Rate 08/12/19 1710 92     Resp 08/12/19 1710 18     Temp 08/12/19 1710 99.6 F (37.6 C)     Temp Source 08/12/19 1710 Oral     SpO2 08/12/19 1710 92 %     Weight --      Height --      Head Circumference --      Peak Flow --  Pain Score 08/12/19 1711 8     Pain Loc --      Pain Edu? --      Excl. in [place]? --    No data found.  Updated Vital Signs BP (!) 154/103 (BP Location: Left Arm)   Pulse 92   Temp 99.6 F (37.6 C) (Oral)   Resp 18   LMP 07/26/2019   SpO2 96%   Physical Exam Constitutional:      General: She is not in acute distress.    Appearance: Normal appearance. She is not ill-appearing, toxic-appearing or diaphoretic.  HENT:     Head: Normocephalic and atraumatic.     Mouth/Throat:     Mouth: Mucous membranes are moist.     Pharynx: Oropharynx is clear. Uvula midline.  Cardiovascular:     Rate and Rhythm: Normal rate and regular rhythm.     Heart sounds: Normal heart sounds. No murmur. No friction rub. No gallop.   Pulmonary:     Effort: Pulmonary effort is normal. No accessory muscle  usage, prolonged expiration, respiratory distress or retractions.     Comments: Patient coughing throughout visit. Coughing worse with deep breathing. Lungs clear to auscultation without adventitious lung sounds. Musculoskeletal:     Cervical back: Normal range of motion and neck supple.  Neurological:     General: No focal deficit present.     Mental Status: She is alert and oriented to person, place, and time.     UC Treatments / Results  Labs (all labs ordered are listed, but only abnormal results are displayed) Labs Reviewed  NOVEL CORONAVIRUS, NAA    EKG   Radiology No results found.  Procedures Procedures (including critical care time)  Medications Ordered in UC Medications  AeroChamber Plus Flo-Vu Medium MISC 1 each (has no administration in time range)  dexamethasone (DECADRON) injection 10 mg (10 mg Intramuscular Given 08/12/19 1740)  albuterol (VENTOLIN HFA) 108 (90 Base) MCG/ACT inhaler 2 puff (2 puffs Inhalation Given 08/12/19 1738)    Initial Impression / Assessment and Plan / UC Course  I have reviewed the triage vital signs and the nursing notes.  Pertinent labs & imaging results that were available during my care of the patient were reviewed by me and considered in my medical decision making (see chart for details).    Albuterol 2 puffs in office with relief of patient's pleuritic chest pain. She was able to take deeper breaths as well. LCTAB. Will provide decadron injection in office today.   COVID PCR test ordered. Patient to quarantine until testing results return.  Continue albuterol as needed.  Symptomatic treatment discussed.  Push fluids.  Discussed with patient, if symptoms not improving, worsening wheezing, worsening pleuritic chest pain, may need prednisone PO course.  To contact office if needing. Return precautions given. Patient expresses understanding and agrees to plan.  Final Clinical Impressions(s) / UC Diagnoses   Final diagnoses:  SOB  (shortness of breath)  Mild intermittent reactive airway disease with acute exacerbation   ED Prescriptions    Medication Sig Dispense Auth. Provider   benzonatate (TESSALON) 200 MG capsule Take 1 capsule (200 mg total) by mouth every 8 (eight) hours. 21 capsule Ok Edwards, PA-C     PDMP not reviewed this encounter.   Ok Edwards, PA-C 08/12/19 1753

## 2019-08-14 LAB — NOVEL CORONAVIRUS, NAA: SARS-CoV-2, NAA: NOT DETECTED

## 2019-08-19 ENCOUNTER — Telehealth: Payer: Self-pay | Admitting: Family Medicine

## 2019-08-19 NOTE — Telephone Encounter (Signed)
Called left message for Pt to call the office to set up appointment

## 2019-08-25 DIAGNOSIS — Z6841 Body Mass Index (BMI) 40.0 and over, adult: Secondary | ICD-10-CM | POA: Diagnosis not present

## 2019-08-25 DIAGNOSIS — D259 Leiomyoma of uterus, unspecified: Secondary | ICD-10-CM | POA: Diagnosis not present

## 2019-08-25 DIAGNOSIS — N926 Irregular menstruation, unspecified: Secondary | ICD-10-CM | POA: Diagnosis not present

## 2019-12-05 ENCOUNTER — Other Ambulatory Visit: Payer: Self-pay

## 2019-12-05 ENCOUNTER — Emergency Department (HOSPITAL_COMMUNITY)
Admission: EM | Admit: 2019-12-05 | Discharge: 2019-12-05 | Disposition: A | Discharge status: Home / Self Care | Payer: BC Managed Care – PPO | Attending: Emergency Medicine | Admitting: Emergency Medicine

## 2019-12-05 ENCOUNTER — Emergency Department (HOSPITAL_COMMUNITY): Payer: BC Managed Care – PPO

## 2019-12-05 ENCOUNTER — Encounter (HOSPITAL_COMMUNITY): Payer: Self-pay

## 2019-12-05 DIAGNOSIS — R42 Dizziness and giddiness: Secondary | ICD-10-CM | POA: Diagnosis not present

## 2019-12-05 DIAGNOSIS — R55 Syncope and collapse: Secondary | ICD-10-CM | POA: Diagnosis not present

## 2019-12-05 DIAGNOSIS — Z79899 Other long term (current) drug therapy: Secondary | ICD-10-CM | POA: Insufficient documentation

## 2019-12-05 DIAGNOSIS — I10 Essential (primary) hypertension: Secondary | ICD-10-CM | POA: Diagnosis not present

## 2019-12-05 DIAGNOSIS — R519 Headache, unspecified: Secondary | ICD-10-CM | POA: Diagnosis not present

## 2019-12-05 DIAGNOSIS — R930 Abnormal findings on diagnostic imaging of skull and head, not elsewhere classified: Secondary | ICD-10-CM

## 2019-12-05 DIAGNOSIS — R001 Bradycardia, unspecified: Secondary | ICD-10-CM | POA: Diagnosis not present

## 2019-12-05 LAB — URINALYSIS, ROUTINE W REFLEX MICROSCOPIC
Bilirubin Urine: NEGATIVE
Glucose, UA: NEGATIVE mg/dL
Hgb urine dipstick: NEGATIVE
Ketones, ur: NEGATIVE mg/dL
Leukocytes,Ua: NEGATIVE
Nitrite: POSITIVE — AB
Protein, ur: NEGATIVE mg/dL
Specific Gravity, Urine: 1.019 (ref 1.005–1.030)
pH: 6 (ref 5.0–8.0)

## 2019-12-05 LAB — BASIC METABOLIC PANEL
Anion gap: 9 (ref 5–15)
BUN: 16 mg/dL (ref 6–20)
CO2: 27 mmol/L (ref 22–32)
Calcium: 8.5 mg/dL — ABNORMAL LOW (ref 8.9–10.3)
Chloride: 104 mmol/L (ref 98–111)
Creatinine, Ser: 1.21 mg/dL — ABNORMAL HIGH (ref 0.44–1.00)
GFR calc Af Amer: >60 mL/min (ref >60)
GFR calc non Af Amer: 57 mL/min — ABNORMAL LOW (ref >60)
Glucose, Bld: 112 mg/dL — ABNORMAL HIGH (ref 70–99)
Potassium: 3.7 mmol/L (ref 3.5–5.1)
Sodium: 140 mmol/L (ref 135–145)

## 2019-12-05 LAB — CBC
HCT: 40.5 % (ref 36.0–46.0)
Hemoglobin: 12.3 g/dL (ref 12.0–15.0)
MCH: 25.9 pg — ABNORMAL LOW (ref 26.0–34.0)
MCHC: 30.4 g/dL (ref 30.0–36.0)
MCV: 85.4 fL (ref 80.0–100.0)
Platelets: 303 10*3/uL (ref 150–400)
RBC: 4.74 MIL/uL (ref 3.87–5.11)
RDW: 14.4 % (ref 11.5–15.5)
WBC: 8 10*3/uL (ref 4.0–10.5)
nRBC: 0 % (ref 0.0–0.2)

## 2019-12-05 LAB — I-STAT BETA HCG BLOOD, ED (MC, WL, AP ONLY): I-stat hCG, quantitative: <5 m[IU]/mL (ref <5)

## 2019-12-05 MED ORDER — SODIUM CHLORIDE 0.9% FLUSH: 3.0000 mL | Freq: Once | INTRAVENOUS | Status: DC

## 2019-12-05 MED ORDER — DIPHENHYDRAMINE HCL 50 MG/ML IJ SOLN
12.5000 mg | Freq: Once | INTRAMUSCULAR | Status: AC
  Administered 2019-12-05: 12.5 mg via INTRAVENOUS
  Filled 2019-12-05: qty 1

## 2019-12-05 MED ORDER — SODIUM CHLORIDE 0.9 % IV BOLUS
1000.0000 mL | Freq: Once | INTRAVENOUS | Status: AC
  Administered 2019-12-05: 1000 mL via INTRAVENOUS

## 2019-12-05 MED ORDER — PROCHLORPERAZINE EDISYLATE 10 MG/2ML IJ SOLN
10.0000 mg | Freq: Once | INTRAMUSCULAR | Status: AC
  Administered 2019-12-05: 10 mg via INTRAVENOUS
  Filled 2019-12-05: qty 2

## 2019-12-05 NOTE — ED Provider Notes (Signed)
[facility] DEPT Provider Note   CSN: [id number] Arrival date & time: 12/05/19  1322     History Chief Complaint  Patient presents with  . Loss of Consciousness    Chloe Castillo is a 39 y.o. female.  The history is provided by the patient.  Loss of Consciousness Episode history:  Single Progression:  Resolved Chronicity:  New Context comment:  Patient did not feel that well today got warm and flushed and passed out.  Has a headache now.  Does not eat or drink today Relieved by:  Bed rest Worsened by:  Nothing Associated symptoms: headaches and malaise/fatigue   Associated symptoms: no anxiety, no chest pain, no confusion, no diaphoresis, no difficulty breathing, no dizziness, no fever, no focal sensory loss, no focal weakness, no nausea, no palpitations, no seizures, no shortness of breath, no vomiting and no weakness   Risk factors: no seizures        Past Medical History:  Diagnosis Date  . Fibroids   . Hypertension   . Morbidly obese [facility])     Patient Active Problem List   Diagnosis Date Noted  . Fibroids 01/02/2019  . BMI 50.0-59.9, adult ([place]) 11/04/2018  . Irregular periods/menstrual cycles 11/04/2018  . Seasonal allergies 11/04/2018  . Bilateral foot pain 10/01/2018  . Pre-diabetes 05/06/2018  . Elevated blood pressure reading without diagnosis of hypertension 12/24/2016  . Hx of migraine headaches 12/24/2016  . Class 3 severe obesity due to excess calories with serious comorbidity and body mass index (BMI) of 50.0 to 59.9 in adult [facility]) 12/24/2016    History reviewed. No pertinent surgical history.   OB History   No obstetric history on file.     Family History  Problem Relation Age of Onset  . Hypertension Mother   . Hypertension Father     Social History   Tobacco Use  . Smoking status: Never Smoker  . Smokeless tobacco: Never Used  Substance Use Topics  . Alcohol use: No  . Drug use: No    Home  Medications Prior to Admission medications   Medication Sig Start Date End Date Taking? Authorizing Provider  acetaminophen (TYLENOL) 500 MG tablet Take 1,000 mg by mouth every 6 (six) hours as needed (headache).    [provider]  benzonatate (TESSALON) 200 MG capsule Take 1 capsule (200 mg total) by mouth every 8 (eight) hours. 08/12/19   Tasia Catchings, Amy V, PA-C  cetirizine (ZYRTEC) 10 MG tablet Take 1 tablet (10 mg total) by mouth daily. 11/04/18   Azzie Glatter, FNP  cyclobenzaprine (FLEXERIL) 10 MG tablet Take 1 tablet (10 mg total) by mouth 3 (three) times daily as needed for muscle spasms. 01/31/17   Street, Mercedes, PA-C  lisinopril (PRINIVIL,ZESTRIL) 20 MG tablet Take 1 tablet (20 mg total) by mouth daily. 11/04/18   Azzie Glatter, FNP  naproxen (NAPROSYN) 500 MG tablet Take 1 tablet (500 mg total) by mouth 2 (two) times daily. 11/29/18   Petrucelli, Glynda Jaeger, PA-C    Allergies    Patient has no known allergies.  Review of Systems   Review of Systems  Constitutional: Positive for malaise/fatigue. Negative for chills, diaphoresis and fever.  HENT: Negative for ear pain and sore throat.   Eyes: Negative for pain and visual disturbance.  Respiratory: Negative for cough and shortness of breath.   Cardiovascular: Positive for syncope. Negative for chest pain and palpitations.  Gastrointestinal: Negative for abdominal pain, nausea and vomiting.  Genitourinary: Negative  for dysuria and hematuria.  Musculoskeletal: Negative for arthralgias and back pain.  Skin: Negative for color change and rash.  Neurological: Positive for syncope (resolved), light-headedness and headaches. Negative for dizziness, tremors, focal weakness, seizures, facial asymmetry, speech difficulty, weakness and numbness.  Psychiatric/Behavioral: Negative for confusion.  All other systems reviewed and are negative.   Physical Exam Updated Vital Signs  ED Triage Vitals  Enc Vitals Group     BP 12/05/19  1331 (!) 142/100     Pulse Rate 12/05/19 1331 81     Resp 12/05/19 1331 16     Temp 12/05/19 1331 98.2 F (36.8 C)     Temp Source 12/05/19 1331 Oral     SpO2 12/05/19 1331 100 %     Weight 12/05/19 1334 (!) 337 lb (152.9 kg)     Height 12/05/19 1334 5\' 4"  (1.626 m)     Head Circumference --      Peak Flow --      Pain Score 12/05/19 1333 9     Pain Loc --      Pain Edu? --      Excl. in [place]? --     Physical Exam Vitals and nursing note reviewed.  Constitutional:      General: She is not in acute distress.    Appearance: She is well-developed. She is not ill-appearing.  HENT:     Head: Normocephalic and atraumatic.     Nose: Nose normal.     Mouth/Throat:     Mouth: Mucous membranes are moist.  Eyes:     Extraocular Movements: Extraocular movements intact.     Conjunctiva/sclera: Conjunctivae normal.     Pupils: Pupils are equal, round, and reactive to light.  Cardiovascular:     Rate and Rhythm: Normal rate and regular rhythm.     Pulses: Normal pulses.     Heart sounds: Normal heart sounds. No murmur.  Pulmonary:     Effort: Pulmonary effort is normal. No respiratory distress.     Breath sounds: Normal breath sounds.  Abdominal:     General: Abdomen is flat.     Palpations: Abdomen is soft.     Tenderness: There is no abdominal tenderness.  Musculoskeletal:        General: No tenderness. Normal range of motion.     Cervical back: Normal range of motion and neck supple.  Skin:    General: Skin is warm and dry.     Capillary Refill: Capillary refill takes less than 2 seconds.  Neurological:     General: No focal deficit present.     Mental Status: She is alert and oriented to person, place, and time.     Cranial Nerves: No cranial nerve deficit.     Sensory: No sensory deficit.     Motor: No weakness.     Coordination: Coordination normal.     Comments: 5+ out of 5 strength throughout, normal sensation, normal finger-nose-finger, normal speech  Psychiatric:          Mood and Affect: Mood normal.     ED Results / Procedures / Treatments   Labs (all labs ordered are listed, but only abnormal results are displayed) Labs Reviewed  BASIC METABOLIC PANEL - Abnormal; Notable for the following components:      Result Value   Glucose, Bld 112 (*)    Creatinine, Ser 1.21 (*)    Calcium 8.5 (*)    GFR calc non Af Amer 57 (*)  All other components within normal limits  CBC - Abnormal; Notable for the following components:   MCH 25.9 (*)    All other components within normal limits  URINALYSIS, ROUTINE W REFLEX MICROSCOPIC  I-STAT BETA HCG BLOOD, ED ([facility], [facility], [facility] ONLY)    EKG EKG Interpretation  Date/Time:  Friday December 05 2019 13:42:34 EDT Ventricular Rate:  74 PR Interval:    QRS Duration: 90 QT Interval:  377 QTC Calculation: 419 R Axis:   50 Text Interpretation: Sinus rhythm Borderline T abnormalities, anterior leads Confirmed by Lennice Sites [phone]) on 12/05/2019 1:45:17 PM   Radiology CT Head Wo Contrast  Result Date: 12/05/2019 CLINICAL DATA:  Headache, dizziness EXAM: CT HEAD WITHOUT CONTRAST TECHNIQUE: Contiguous axial images were obtained from the base of the skull through the vertex without intravenous contrast. COMPARISON:  None. FINDINGS: Brain: No evidence of acute infarction, hemorrhage, hydrocephalus, extra-axial collection or mass lesion/mass effect. 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 Vascular: No hyperdense vessel or unexpected calcification. Skull: Normal. Negative for fracture or focal lesion. Sinuses/Orbits: No acute finding. Other: None. IMPRESSION: 1.  No acute intracranial pathology. 2. 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 MRI may be helpful to further evaluate. Electronically Signed   By: Eddie Candle M.D.   On: 12/05/2019 16:04   DG Chest Portable 1 View  Result Date: 12/05/2019 CLINICAL  DATA:  Syncopal episode EXAM: PORTABLE CHEST 1 VIEW COMPARISON:  01/11/2016 FINDINGS: Cardiac shadow is at the upper limits of normal in size. The lungs are well aerated bilaterally. No focal infiltrate or sizable effusion is seen. IMPRESSION: No acute abnormality noted. Electronically Signed   By: Inez Catalina M.D.   On: 12/05/2019 15:02    Procedures Procedures (including critical care time)  Medications Ordered in ED Medications  sodium chloride flush (NS) 0.9 % injection 3 mL (has no administration in time range)  sodium chloride 0.9 % bolus 1,000 mL (1,000 mLs Intravenous New Bag/Given 12/05/19 1506)  prochlorperazine (COMPAZINE) injection 10 mg (10 mg Intravenous Given 12/05/19 1500)  diphenhydrAMINE (BENADRYL) injection 12.5 mg (12.5 mg Intravenous Given 12/05/19 1501)    ED Course  I have reviewed the triage vital signs and the nursing notes.  Pertinent labs & imaging results that were available during my care of the patient were reviewed by me and considered in my medical decision making (see chart for details).    MDM Rules/Calculators/A&P                      Daizie Ally is a 39 year old female history of hypertension who had syncopal event while at work today.  Patient with unremarkable vitals.  No fever.  Patient felt warm, lightheaded and passed out.  Does not remember everything that happened.  Has a mild headache.  Neurologically she is intact.  EKG shows sinus rhythm.  No ischemic changes.  No shortness of breath.  States that she did not eat or drink anything today.  Will give IV fluids and check labs.  Given fall and head trauma and headache will get a head CT.  Pregnancy test is negative.  Kidney function mildly elevated but otherwise normal labs.  No significant electrolyte adamantly, leukocytosis, anemia.    Head CT showed possible lacunar stroke overall age indeterminant likely chronic however given her age and syncopal event today will get MRI with and  without contrast  to evaluate for intracranial process. Patient otherwise is feeling improved following headache cocktail. Urinalysis also pending. Patient signed out to oncoming ED staff with patient pending MRI and reevaluation.  This chart was dictated using voice recognition software.  Despite best efforts to proofread,  errors can occur which can change the documentation meaning.    Final Clinical Impression(s) / ED Diagnoses Final diagnoses:  Syncope and collapse    Rx / DC Orders ED Discharge Orders    None       Lennice Sites, DO 12/05/19 1638

## 2019-12-05 NOTE — Discharge Instructions (Signed)
Your work-up today was overall reassuring in regards to the laboratory testing and monitoring.  Your CT showed no acute abnormalities but did show the age-indeterminate abnormality concerning for a possible infarct.  MRI was recommended by radiology and after our shared decision-making conversation, we agreed that as you are feeling better, it is appropriate for you to do this as an outpatient.  Please call the outpatient neurology team to schedule an appointment where they can see you and order that MRI.  If you develop any new or worsened symptoms, please return to the nearest emergency department for further work-up and management.

## 2019-12-05 NOTE — ED Provider Notes (Signed)
4:36 PM Care assumed from Dr. Ronnald Nian.  At time of transfer care, patient is awaiting results of MRI with and without contrast to evaluate and further characterize possible previous stroke.  If MRI is reassuring, dissipate discharge with outpatient follow-up for her syncopal episode.  If new stroke or other significant antibiotics discovered, will determine disposition at that time.  5:37 PM Was just called into the room by the patient to discuss her work-up.  She is not interested in waiting get an MRI with and without contrast to further evaluate the abnormality seen on the CT scan of possible infarct.  Patient would rather follow-up with outpatient neurology and get that scheduled.  She reports he is feeling much better after fluids and is not feeling lightheaded anymore.  She currently denies any focal neurologic complaints.  She does not want to wait in the emergency department any further and understands return precautions.  She also understands the risk of missing a stroke or other abnormality such as MS or malignancy.  Was on the phone with her sister who understood the plan of care as well.  Patient will be discharged and will follow up with outpatient neurology to discuss further work-up for her near syncopal/syncopal episode and imaging abnormality on her head CT.  Clinical Impression: 1. Syncope and collapse   2. Abnormal head CT     Disposition: Discharge  Condition: Good  I have discussed the results, Dx and Tx plan with the pt(& family if present). He/she/they expressed understanding and agree(s) with the plan. Discharge instructions discussed at great length. Strict return precautions discussed and pt &/or family have verbalized understanding of the instructions. No further questions at time of discharge.    New Prescriptions   No medications on file    Follow Up: [facility] [address]     [address] [place]  [phone]  call for neuro appointment     Abisai Coble, Gwenyth Allegra, MD 12/05/19 1739

## 2019-12-05 NOTE — ED Triage Notes (Signed)
Per EMS- Patient was at work and had a syncopal episode.

## 2019-12-10 ENCOUNTER — Encounter: Payer: Self-pay | Admitting: Neurology

## 2019-12-10 ENCOUNTER — Ambulatory Visit: Payer: BC Managed Care – PPO | Admitting: Neurology

## 2019-12-10 ENCOUNTER — Other Ambulatory Visit: Payer: Self-pay

## 2019-12-10 VITALS — Temp 97.4°F | Ht 64.0 in | Wt 339.3 lb

## 2019-12-10 DIAGNOSIS — R0683 Snoring: Secondary | ICD-10-CM

## 2019-12-10 DIAGNOSIS — R930 Abnormal findings on diagnostic imaging of skull and head, not elsewhere classified: Secondary | ICD-10-CM | POA: Diagnosis not present

## 2019-12-10 DIAGNOSIS — R519 Headache, unspecified: Secondary | ICD-10-CM

## 2019-12-10 DIAGNOSIS — Z72820 Sleep deprivation: Secondary | ICD-10-CM

## 2019-12-10 DIAGNOSIS — R55 Syncope and collapse: Secondary | ICD-10-CM

## 2019-12-10 DIAGNOSIS — Z6841 Body Mass Index (BMI) 40.0 and over, adult: Secondary | ICD-10-CM

## 2019-12-10 NOTE — Patient Instructions (Addendum)
Your neurological exam is normal, I could not see in the back of your eyes.  Please make an appointment for a full eye exam with an eye doctor of your choosing as you have eye glasses, but have not seen an eye doctor in 12 or 13 years.   Vasovagal syncope is one of the most common causes of fainting. Vasovagal syncope occurs when your body overreacts to certain triggers, such as the sight of blood or extreme emotional distress or overheating.  The vasovagal syncope trigger causes a sudden drop in your heart rate and blood pressure, which leads to reduced blood flow to your brain, which results in a brief loss of consciousness. Vasovagal syncope is usually harmless and requires no treatment. However, if you collapse and fall, it is possible you may injure yourself.  Pre-sycopal symptoms include (but are not limited to): Skin paleness, lightheadedness, tunnel vision, nausea, a rising feeling of warmth, feeling cold and clammy, excess yawning, and blurry vision. You may have jerky, abnormal movements, a slow and weak pulse and dilated pupils and you may have some confusion and mental slowing when you come to.  Common triggers for vasovagal syncope include (but are not limited to): Standing for long periods of time, heat exposure, the sight of blood or having blood drawn, fear and straining, such as during a bowel movement.  There is no specific medication for treatment of fainting. Sometimes we use medications to keep the blood pressure elevated but this is rare. Supportive treatment includes foot exercises, wearing compression stockings or tensing your leg muscles when standing, increasing salt in your diet (unless you have high blood pressure). Avoid prolonged standing -- especially in hot, crowded places -- and drink plenty of fluids and change position from sitting to standing or lying to standing slowly and avoid overheating.    You may be at risk for sleep apnea.  I would like to proceed with a sleep  study. I will order a brain MRI as you did not have it done in the emergency room as recommended.  We will call you with your MRI results. We will also do an EEG (brainwave test), which we will schedule. We will call you with the results.  Please talk to your primary care about a referral to a cardiologist to rule out a heart related cause for your passing out spell.

## 2019-12-10 NOTE — Progress Notes (Signed)
Subjective:    Patient ID: Chloe Castillo is a 39 y.o. female.  HPI     Chloe Age, Chloe Castillo, Chloe Castillo [facility] [address] [place], [place] [address]  I saw patient, Chloe Castillo, as a referral from the ER for Evaluation of a syncopal spell.  The patient is unaccompanied today.  She is a 39 year old right-handed woman with an underlying medical history of hypertension, and morbid obesity with a BMI of over 50, who presented to the emergency room after an unwitnessed collapse at work. She had felt flushed and lightheaded and lost consciousness in the bathroom at work; was found by a Art therapist, EMS was called. No convulsions, no B/B incontinence.    I reviewed the emergency room records from 12/05/2019.  She had a head CT without contrast On 12/05/2019 and I reviewed the results:  IMPRESSION: 1.  No acute intracranial pathology.   2. 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 MRI may be helpful to further evaluate. She was treated symptomatically with fluids.  Laboratory work-up showed no obvious abnormality with the exception of slightly elevated creatinine at 1.21.  Given the CT abnormality she was advised to proceed with a brain MRI with and without contrast but she declined and opted to get discharged from the emergency room.  She denies any recent one-sided weakness or numbness or tingling or droopy face or slurring of speech.  She has some blurry vision and sometimes she has light sensitivity.  She reports that she has prescription eyeglasses but lost them and has not seen an eye doctor in 12 to 13 years.  She reports that her primary care physician wanted her to have a sleep study.  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.  She works 10 to 12-hour shifts.  Typically  she is at work around 4.  She lives with her children, she is single, her boyfriend lives on the [place].  She has 6 children, ages 66, 22, 41, 37, 49, and 5.  Her 39 year old is in the [employer] and away, otherwise she lives with her 5 children.  She has no night to night nocturia but has woken up with a headache.  She does not drink any sodas, she tries to hydrate well with water.  She has not had any vertiginous symptoms.  Her Past Medical History Is Significant For: Past Medical History:  Diagnosis Date  . Fibroids   . Hypertension   . Morbidly obese ([place])     Her Past Surgical History Is Significant For: No past surgical history on file.  Her Family History Is Significant For: Family History  Problem Relation Castillo of Onset  . Hypertension Mother   . Hypertension Father     Her Social History Is Significant For: Social History   Socioeconomic History  . Marital status: Single    Spouse name: Not on file  . Number of children: Not on file  . Years of education: Not on file  . Highest education level: Not on file  Occupational History  . Not on file  Tobacco Use  . Smoking status: Never Smoker  . Smokeless tobacco: Never Used  Substance and Sexual Activity  . Alcohol use: No  . Drug use: No  . Sexual activity:  Never  Other Topics Concern  . Not on file  Social History Narrative  . Not on file   Social Determinants of Health   Financial Resource Strain:   . Difficulty of Paying Living Expenses:   Food Insecurity:   . Worried About Charity fundraiser in the Last Year:   . Arboriculturist in the Last Year:   Transportation Needs:   . Film/video editor (Medical):   Marland Kitchen Lack of Transportation (Non-Medical):   Physical Activity:   . Days of Exercise per Week:   . Minutes of Exercise per Session:   Stress:   . Feeling of Stress :   Social Connections:   . Frequency of Communication with Friends and Family:   . Frequency of Social Gatherings with Friends and  Family:   . Attends Religious Services:   . Active Member of Clubs or Organizations:   . Attends Archivist Meetings:   Marland Kitchen Marital Status:     Her Allergies Are:  No Known Allergies:   Her Current Medications Are:  Outpatient Encounter Medications as of 12/10/2019  Medication Sig  . acetaminophen (TYLENOL) 500 MG tablet Take 1,000 mg by mouth every 6 (six) hours as needed (headache).  . [DISCONTINUED] benzonatate (TESSALON) 200 MG capsule Take 1 capsule (200 mg total) by mouth every 8 (eight) hours. (Patient not taking: Reported on 12/05/2019)  . [DISCONTINUED] cetirizine (ZYRTEC) 10 MG tablet Take 1 tablet (10 mg total) by mouth daily. (Patient not taking: Reported on 12/05/2019)  . [DISCONTINUED] cyclobenzaprine (FLEXERIL) 10 MG tablet Take 1 tablet (10 mg total) by mouth 3 (three) times daily as needed for muscle spasms. (Patient not taking: Reported on 12/05/2019)  . [DISCONTINUED] lisinopril (PRINIVIL,ZESTRIL) 20 MG tablet Take 1 tablet (20 mg total) by mouth daily. (Patient not taking: Reported on 12/05/2019)  . [DISCONTINUED] naproxen (NAPROSYN) 500 MG tablet Take 1 tablet (500 mg total) by mouth 2 (two) times daily. (Patient not taking: Reported on 12/05/2019)   No facility-administered encounter medications on file as of 12/10/2019.  :  Review of Systems:  Out of a complete 14 point review of systems, all are reviewed and negative with the exception of these symptoms as listed below: Review of Systems  Neurological: Negative for light-headedness.       Pt report last Friday at she had a syncopal event and passed out and was unconscious for several minutes. Went to the ED CT was completed and evidence of possible stroke was seen. Pt declined MRI recommended by ED due to wait time.   Has not had a syncopal event since. Reports a similar event 4-5 years ago.    Epworth Sleepiness Scale 0= would never doze 1= slight chance of dozing 2= moderate chance of dozing 3= high  chance of dozing  Sitting and reading:1 Watching TV:3 Sitting inactive in a public place (ex. Theater or meeting):0 As a passenger in a car for an hour without a break:0 Lying down to rest in the afternoon:3 Sitting and talking to someone:0 Sitting quietly after lunch (no alcohol):0 In a car, while stopped in traffic:0 Total:7     Objective:  Neurological Exam  Physical Exam Physical Examination:   Vitals:   12/10/19 1424  Temp: (!) 97.4 F (36.3 C)   On orthostatic testing, she has no significant drop in systolic or diastolic blood pressure.  She denies any orthostatic lightheadedness and vertiginous symptoms.  General Examination: The patient is a very pleasant 39 y.o.  female in no acute distress. She appears well-developed and well-nourished and well groomed.   HEENT: Normocephalic, atraumatic, pupils are equal, round and reactive to light and accommodation. Funduscopic is not possible, as she feels the light bothers her and keeps closing her eyes. Extraocular tracking is good without limitation to gaze excursion or nystagmus noted. Normal smooth pursuit is noted. Hearing is grossly intact. Face is symmetric with normal facial animation and normal facial sensation. Speech is clear with no dysarthria noted. There is no hypophonia. There is no lip, neck/head, jaw or voice tremor. Neck is supple with full range of passive and active motion. There are no carotid bruits on auscultation. Oropharynx exam reveals: mild mouth dryness, adequate dental hygiene and marked airway crowding, due to thicker soft palate, smaller airway, larger uvula, tonsils of 2+. Mallampati is class III. Tongue protrudes centrally and palate elevates symmetrically. Neck size is 19.25 inches.   Chest: Clear to auscultation without wheezing, rhonchi or crackles noted.  Heart: S1+S2+0, regular and normal without murmurs, rubs or gallops noted.   Abdomen: Soft, non-tender and non-distended with normal bowel sounds  appreciated on auscultation.  Extremities: There is no pitting edema in the distal lower extremities bilaterally. Pedal pulses are intact.  Skin: Warm and dry without trophic changes noted.  Musculoskeletal: exam reveals no obvious joint deformities, tenderness or joint swelling or erythema.   Neurologically:  Mental status: The patient is awake, alert and oriented in all 4 spheres. Her immediate and remote memory, attention, language skills and fund of knowledge are appropriate. There is no evidence of aphasia, agnosia, apraxia or anomia. Speech is clear with normal prosody and enunciation. Thought process is linear. Mood is normal and affect is normal.  Cranial nerves II - XII are as described above under HEENT exam. In addition: shoulder shrug is normal with equal shoulder height noted. Motor exam: Normal bulk, strength and tone is noted. There is no drift, tremor or rebound. Romberg is negative. Reflexes are 1+ in the UEs, and trace in the LEs. Babinski: Toes are flexor bilaterally. Fine motor skills and coordination: intact with normal finger taps, normal hand movements, normal rapid alternating patting, normal foot taps and normal foot agility.  Cerebellar testing: No dysmetria or intention tremor on finger to nose testing. Heel to shin is difficult due to body habitus, no truncal or gait ataxia.  Sensory exam: intact to light touch, vibration, temperature sense roprioception in the upper and lower extremities.  Gait, station and balance: She stands easily. No veering to one side is noted. No leaning to one side is noted. Posture is Castillo-appropriate and stance is narrow based. Gait shows normal stride length and normal pace. No problems turning are noted. Tandem walk is unremarkable.   Assessment and Plan:   In summary, Chloe Castillo is a very pleasant 39 y.o.-year old female with an underlying medical history of hypertension, and morbid obesity with a BMI of over 50, who presents as a referral  from the emergency room for evaluation of a suspected syncopal spell at work.  She has a nonfocal neurological exam, reports light sensitivity which is not new.  She has prescription eyeglasses which she lost some years ago and has not had them eye exam in 12 to 13 years.  She is encouraged to get a formal eye examination done for completion.  She may have had a vasovagal syncope, orthostatic hypotension, collapse from sleep deprivation, dehydration, or secondary to her cardiac cause, less likely seizure.  Neurological exam is  normal, no obvious orthostatic hypotension, no vertigo. She is largely reassured today but is advised to complete her neuroimaging with a brain MRI with and without contrast which was recommended based on a finding on her noncontrasted head CT in the ER.  In addition, she may be at risk for underlying obstructive sleep apnea.  We talked about the importance of good hydration, getting enough rest.  She is advised to try to get 7 to 8 hours of sleep.  She is agreeable to pursuing a sleep study to rule out obstructive sleep apnea.  In addition, I suggested we proceed with an EEG.  We will call her with her test results and follow-up after testing.  She is advised to talk to her primary care physician about a referral to cardiology to investigate a possible underlying cardiac cause of her collapse.  We will call her to schedule her brain MRI and sleep study after insurance authorization.  I answered all her questions today and she was in agreement with the plan.  Chloe Age, Chloe Castillo, Chloe Castillo

## 2019-12-16 ENCOUNTER — Telehealth: Payer: Self-pay

## 2019-12-16 NOTE — Telephone Encounter (Signed)
Unable to LVM for pt to call me back to schedule sleep study. Pt's only number rings busy.

## 2019-12-17 ENCOUNTER — Telehealth: Payer: Self-pay | Admitting: Neurology

## 2019-12-17 NOTE — Telephone Encounter (Signed)
unable to get a hold of the patient every time i call the patient the phone is a busy sign  Chloe Castillo: [id number] (exp. 12/12/19 to 06/08/20)

## 2019-12-18 NOTE — Telephone Encounter (Signed)
x2 unable to get a hold of patient. Busy signal.

## 2019-12-30 ENCOUNTER — Telehealth: Payer: Self-pay

## 2019-12-30 NOTE — Telephone Encounter (Signed)
We have attempted to call the patient three times to schedule sleep study.  Patient has been unavailable at the phone numbers we have on file and unable to leave a message due to phone ringing busy.  If patient calls back we will schedule them for their sleep study.

## 2020-01-12 ENCOUNTER — Other Ambulatory Visit: Payer: BC Managed Care – PPO

## 2020-01-12 ENCOUNTER — Encounter: Payer: Self-pay | Admitting: Neurology

## 2020-03-22 DIAGNOSIS — R7303 Prediabetes: Secondary | ICD-10-CM | POA: Diagnosis not present

## 2020-03-22 DIAGNOSIS — Z6841 Body Mass Index (BMI) 40.0 and over, adult: Secondary | ICD-10-CM | POA: Diagnosis not present

## 2020-03-22 DIAGNOSIS — Z01419 Encounter for gynecological examination (general) (routine) without abnormal findings: Secondary | ICD-10-CM | POA: Diagnosis not present

## 2020-03-22 DIAGNOSIS — Z124 Encounter for screening for malignant neoplasm of cervix: Secondary | ICD-10-CM | POA: Diagnosis not present

## 2020-03-22 DIAGNOSIS — R5383 Other fatigue: Secondary | ICD-10-CM | POA: Diagnosis not present

## 2020-04-14 ENCOUNTER — Ambulatory Visit
Admission: EM | Admit: 2020-04-14 | Discharge: 2020-04-14 | Disposition: A | Discharge status: Home / Self Care | Payer: BC Managed Care – PPO | Attending: Physician Assistant | Admitting: Physician Assistant

## 2020-04-14 ENCOUNTER — Other Ambulatory Visit: Payer: Self-pay

## 2020-04-14 DIAGNOSIS — Z1152 Encounter for screening for COVID-19: Secondary | ICD-10-CM | POA: Diagnosis not present

## 2020-04-14 DIAGNOSIS — J029 Acute pharyngitis, unspecified: Secondary | ICD-10-CM

## 2020-04-14 DIAGNOSIS — R0981 Nasal congestion: Secondary | ICD-10-CM

## 2020-04-14 MED ORDER — FLUTICASONE PROPIONATE 50 MCG/ACT NA SUSP: 2.0000 | Freq: Every day | NASAL | 0 refills | Status: DC

## 2020-04-14 MED ORDER — LIDOCAINE VISCOUS HCL 2 % MT SOLN: OROMUCOSAL | 0 refills | Status: DC

## 2020-04-14 NOTE — ED Triage Notes (Signed)
Pt c/o congestion, sore throat, HA, nausea, subjective fever since yesterday. Denies cough, SOB, chills, v/d, body aches, loss of taste/smell. Has been using OTC essential oil for decongestant.

## 2020-04-14 NOTE — Discharge Instructions (Signed)
COVID PCR testing ordered. I would like you to quarantine until testing results. Start lidocaine for sore throat, do not eat or drink for the next 40 mins after use as it can stunt your gag reflex. You can take over the counter flonase/nasacort to help with nasal congestion/drainage. Tylenol/motrin for pain and fever. Keep hydrated, urine should be clear to pale yellow in color. If experiencing shortness of breath, trouble breathing, go to the emergency department for further evaluation needed.   For sore throat/cough try using a honey-based tea. Use 3 teaspoons of honey with juice squeezed from half lemon. Place shaved pieces of ginger into 1/2-1 cup of water and warm over stove top. Then mix the ingredients and repeat every 4 hours as needed. 

## 2020-04-14 NOTE — ED Provider Notes (Signed)
[facility]    CSN: [id number] Arrival date & time: 04/14/20  0626      History   Chief Complaint Chief Complaint  Patient presents with  . Nasal Congestion    HPI Chloe Castillo is a 39 y.o. female.   39 year old female comes in for 2 day of URI symptoms. Subjective fever, nasal congestion, sore throat. Denies cough. Denies shortness of breath, loss of taste/smell. COVID vaccinated. No known exposure.      Past Medical History:  Diagnosis Date  . Fibroids   . Hypertension   . Morbidly obese [facility])     Patient Active Problem List   Diagnosis Date Noted  . Fibroids 01/02/2019  . BMI 50.0-59.9, adult ([place]) 11/04/2018  . Irregular periods/menstrual cycles 11/04/2018  . Seasonal allergies 11/04/2018  . Bilateral foot pain 10/01/2018  . Pre-diabetes 05/06/2018  . Elevated blood pressure reading without diagnosis of hypertension 12/24/2016  . Hx of migraine headaches 12/24/2016  . Class 3 severe obesity due to excess calories with serious comorbidity and body mass index (BMI) of 50.0 to 59.9 in adult [facility]) 12/24/2016    History reviewed. No pertinent surgical history.  OB History   No obstetric history on file.      Home Medications    Prior to Admission medications   Medication Sig Start Date End Date Taking? Authorizing Provider  acetaminophen (TYLENOL) 500 MG tablet Take 1,000 mg by mouth every 6 (six) hours as needed (headache).    [provider]  fluticasone (FLONASE) 50 MCG/ACT nasal spray Place 2 sprays into both nostrils daily. 04/14/20   Tasia Catchings, [place]ill[place] Didonato V, PA-C  lidocaine (XYLOCAINE) 2 % solution 5-15 mL to gurgle as needed. 04/14/20   Ok Edwards, PA-C    Family History Family History  Problem Relation Age of Onset  . Hypertension Mother   . Hypertension Father     Social History Social History   Tobacco Use  . Smoking status: Never Smoker  . Smokeless tobacco: Never Used  Vaping Use  . Vaping Use: Never used  Substance Use Topics   . Alcohol use: No  . Drug use: No     Allergies   Patient has no known allergies.   Review of Systems Review of Systems  Reason unable to perform ROS: See HPI as above.     Physical Exam Triage Vital Signs ED Triage Vitals  Enc Vitals Group     BP 04/14/20 1112 (!) 165/108     Pulse Rate 04/14/20 1112 73     Resp 04/14/20 1112 20     Temp 04/14/20 1112 97.8 F (36.6 C)     Temp Source 04/14/20 1112 Oral     SpO2 04/14/20 1112 93 %     Weight --      Height --      Head Circumference --      Peak Flow --      Pain Score 04/14/20 1133 0     Pain Loc --      Pain Edu? --      Excl. in [place]? --    No data found.  Updated Vital Signs BP (!) 165/108 (BP Location: Left Arm)   Pulse 73   Temp 97.8 F (36.6 C) (Oral)   Resp 20   LMP 03/12/2020   SpO2 93%   Physical Exam Constitutional:      General: She is not in acute distress.    Appearance: Normal appearance. She  is not ill-appearing, toxic-appearing or diaphoretic.  HENT:     Head: Normocephalic and atraumatic.     Nose:     Right Turbinates: Swollen.     Left Turbinates: Swollen.     Right Sinus: No maxillary sinus tenderness or frontal sinus tenderness.     Left Sinus: No maxillary sinus tenderness or frontal sinus tenderness.     Mouth/Throat:     Mouth: Mucous membranes are moist.     Pharynx: Oropharynx is clear. Uvula midline.  Cardiovascular:     Rate and Rhythm: Normal rate and regular rhythm.     Heart sounds: Normal heart sounds. No murmur heard.  No friction rub. No gallop.   Pulmonary:     Effort: Pulmonary effort is normal. No accessory muscle usage, prolonged expiration, respiratory distress or retractions.     Comments: Lungs clear to auscultation without adventitious lung sounds. Musculoskeletal:     Cervical back: Normal range of motion and neck supple.  Neurological:     General: No focal deficit present.     Mental Status: She is alert and oriented to person, place, and time.       UC Treatments / Results  Labs (all labs ordered are listed, but only abnormal results are displayed) Labs Reviewed  NOVEL CORONAVIRUS, NAA    EKG   Radiology No results found.  Procedures Procedures (including critical care time)  Medications Ordered in UC Medications - No data to display  Initial Impression / Assessment and Plan / UC Course  I have reviewed the triage vital signs and the nursing notes.  Pertinent labs & imaging results that were available during my care of the patient were reviewed by me and considered in my medical decision making (see chart for details).    COVID PCR test ordered. Patient to quarantine until testing results return. No alarming signs on exam. LCTAB. Symptomatic treatment discussed.  Push fluids.  Return precautions given.  Patient expresses understanding and agrees to plan.  Final Clinical Impressions(s) / UC Diagnoses   Final diagnoses:  Encounter for screening for COVID-19  Nasal congestion  Sore throat    ED Prescriptions    Medication Sig Dispense Auth. Provider   fluticasone (FLONASE) 50 MCG/ACT nasal spray Place 2 sprays into both nostrils daily. 1 g [place] Viramontes V, PA-C   lidocaine (XYLOCAINE) 2 % solution 5-15 mL to gurgle as needed. 150 mL Ok Edwards, PA-C     PDMP not reviewed this encounter.   Ok Edwards, PA-C 04/14/20 1201

## 2020-04-16 LAB — NOVEL CORONAVIRUS, NAA: SARS-CoV-2, NAA: NOT DETECTED

## 2020-04-16 LAB — SARS-COV-2, NAA 2 DAY TAT

## 2020-06-26 DIAGNOSIS — Z20822 Contact with and (suspected) exposure to covid-19: Secondary | ICD-10-CM | POA: Diagnosis not present

## 2020-07-08 DIAGNOSIS — R1032 Left lower quadrant pain: Secondary | ICD-10-CM | POA: Diagnosis not present

## 2020-07-08 DIAGNOSIS — Z3202 Encounter for pregnancy test, result negative: Secondary | ICD-10-CM | POA: Diagnosis not present

## 2020-07-08 DIAGNOSIS — Z6841 Body Mass Index (BMI) 40.0 and over, adult: Secondary | ICD-10-CM | POA: Diagnosis not present

## 2020-07-08 DIAGNOSIS — Z7251 High risk heterosexual behavior: Secondary | ICD-10-CM | POA: Diagnosis not present

## 2020-07-17 ENCOUNTER — Encounter (HOSPITAL_COMMUNITY): Payer: Self-pay

## 2020-07-17 ENCOUNTER — Other Ambulatory Visit: Payer: Self-pay

## 2020-07-17 ENCOUNTER — Emergency Department (HOSPITAL_COMMUNITY)
Admission: EM | Admit: 2020-07-17 | Discharge: 2020-07-17 | Disposition: A | Discharge status: Home / Self Care | Payer: BC Managed Care – PPO | Attending: Emergency Medicine | Admitting: Emergency Medicine

## 2020-07-17 DIAGNOSIS — I10 Essential (primary) hypertension: Secondary | ICD-10-CM | POA: Diagnosis not present

## 2020-07-17 DIAGNOSIS — M545 Low back pain, unspecified: Secondary | ICD-10-CM | POA: Diagnosis not present

## 2020-07-17 MED ORDER — CYCLOBENZAPRINE HCL 10 MG PO TABS: 10.0000 mg | ORAL_TABLET | Freq: Two times a day (BID) | ORAL | 0 refills | Status: AC | PRN

## 2020-07-17 MED ORDER — CYCLOBENZAPRINE HCL 10 MG PO TABS: 10.0000 mg | ORAL_TABLET | Freq: Two times a day (BID) | ORAL | 0 refills | Status: DC | PRN

## 2020-07-17 NOTE — ED Provider Notes (Signed)
[facility] DEPT Provider Note   CSN: [id number] Arrival date & time: 07/17/20  7673     History Chief Complaint  Patient presents with   Back Pain    Chloe Castillo is a 39 y.o. female.  HPI   Patient with significant medical history of hypertension presents to the emergency department with chief complaint of lower back pain. Patient states pain started yesterday and has gotten worse. She describes the pain as a sharp, knifelike sensation in her lower back, she denies radiating pain, denies paresthesias or weakness in her lower extremities, urinary retention or incontinency, difficulty with bowel movements. She denies recent trauma to the area, having autoimmune disease or IV drug use. She endorses that she works for 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 She feels that she may have overdone it and strained her back. She states bending makes it worse and  has no alleviating factors. Patient denies fevers, chills, shortness of breath, chest pain, abdominal pain, nausea, vomiting, diarrhea, pedal edema.  Past Medical History:  Diagnosis Date   Fibroids    Hypertension    Morbidly obese [facility])     Patient Active Problem List   Diagnosis Date Noted   Fibroids 01/02/2019   BMI 50.0-59.9, adult ([place]) 11/04/2018   Irregular periods/menstrual cycles 11/04/2018   Seasonal allergies 11/04/2018   Bilateral foot pain 10/01/2018   Pre-diabetes 05/06/2018   Elevated blood pressure reading without diagnosis of hypertension 12/24/2016   Hx of migraine headaches 12/24/2016   Class 3 severe obesity due to excess calories with serious comorbidity and body mass index (BMI) of 50.0 to 59.9 in adult [facility]) 12/24/2016    History reviewed. No pertinent surgical history.   OB History   No obstetric history on file.     Family History  Problem Relation Age of Onset   Hypertension Mother      Hypertension Father     Social History   Tobacco Use   Smoking status: Never Smoker   Smokeless tobacco: Never Used  Vaping Use   Vaping Use: Never used  Substance Use Topics   Alcohol use: No   Drug use: No    Home Medications Prior to Admission medications   Medication Sig Start Date End Date Taking? Authorizing Provider  acetaminophen (TYLENOL) 500 MG tablet Take 1,000 mg by mouth every 6 (six) hours as needed (headache).    [provider]  cyclobenzaprine (FLEXERIL) 10 MG tablet Take 1 tablet (10 mg total) by mouth 2 (two) times daily as needed for up to 10 days for muscle spasms. 07/17/20 07/27/20  Marcello Fennel, PA-C  fluticasone (FLONASE) 50 MCG/ACT nasal spray Place 2 sprays into both nostrils daily. 04/14/20   Tasia Catchings, Amy V, PA-C  lidocaine (XYLOCAINE) 2 % solution 5-15 mL to gurgle as needed. 04/14/20   Ok Edwards, PA-C    Allergies    Patient has no known allergies.  Review of Systems   Review of Systems  Constitutional: Negative for chills and fever.  HENT: Negative for congestion.   Respiratory: Negative for shortness of breath.   Cardiovascular: Negative for chest pain.  Gastrointestinal: Negative for abdominal pain, nausea and vomiting.  Genitourinary: Negative for dysuria and enuresis.  Musculoskeletal: Positive for back pain.  Skin: Negative for rash.  Neurological: Negative for dizziness and headaches.  Hematological: Does not bruise/bleed easily.    Physical Exam Updated Vital Signs BP Marland Kitchen)  191/98 (BP Location: Left Arm)    Pulse 86    Temp 98.1 F (36.7 C) (Oral)    Resp 18    SpO2 97%   Physical Exam Vitals and nursing note reviewed.  Constitutional:      General: She is not in acute distress.    Appearance: She is not ill-appearing.  HENT:     Head: Normocephalic and atraumatic.     Nose: No congestion.  Eyes:     Conjunctiva/sclera: Conjunctivae normal.  Cardiovascular:     Rate and Rhythm: Normal rate and regular rhythm.      Pulses: Normal pulses.     Heart sounds: No murmur heard.  No friction rub. No gallop.   Pulmonary:     Effort: No respiratory distress.     Breath sounds: No wheezing, rhonchi or rales.  Musculoskeletal:     Comments: Patient spine was visualized there is no gross abnormalities noted, she had tenderness to palpation along the perispinal of her lumbar spine, there is no step-off or crepitus noted upon palpation of her spine.  Patient is moving all 4 extremities out difficulty.   Skin:    General: Skin is warm and dry.     Findings: No rash.     Comments: Skin exam was performed there is no edematous, erythematous warm joints noted on exam. No track marks noted  Neurological:     General: No focal deficit present.     Mental Status: She is alert.     Comments: Patient had no difficulty with word finding.  Psychiatric:        Mood and Affect: Mood normal.     ED Results / Procedures / Treatments   Labs (all labs ordered are listed, but only abnormal results are displayed) Labs Reviewed - No data to display  EKG None  Radiology No results found.  Procedures Procedures (including critical care time)  Medications Ordered in ED Medications - No data to display  ED Course  I have reviewed the triage vital signs and the nursing notes.  Pertinent labs & imaging results that were available during my care of the patient were reviewed by me and considered in my medical decision making (see chart for details).    MDM Rules/Calculators/A&P                          Patient presents with lower back pain. She is alert, does not appear in acute distress, vital signs reassuring. Due to well-appearing patient, benign physical exam further lab and imaging not warranted at this time.  I have low suspicion for spinal fracture or spinal cord abnormality as patient denies urinary incontinency, retention, difficulty with bowel movements, denies saddle paresthesias.  Spine was palpated  there is no step-off, crepitus or gross deformities felt, patient had 5/5 strength, full range of motion, neurovascular fully intact in the lower extremities.  Will defer imaging at this time as there is no trauma to the area, no severe findings on exam . Low suspicion for septic arthritis as patient denies IV drug use, skin exam was performed no erythematous, edema or warm joints noted.  Suspect patient suffering from a muscular strain.  Will provide her with muscle relaxer and encourage exercising of her back and follow-up PCP.  Vital signs have remained stable, no indication for hospital admission.  Patient given at home care as well strict return precautions.  Patient verbalized that they understood agreed to  said plan.   Final Clinical Impression(s) / ED Diagnoses Final diagnoses:  Acute bilateral low back pain without sciatica    Rx / DC Orders ED Discharge Orders         Ordered    cyclobenzaprine (FLEXERIL) 10 MG tablet  2 times daily PRN,   Status:  Discontinued        07/17/20 2023    cyclobenzaprine (FLEXERIL) 10 MG tablet  2 times daily PRN        07/17/20 2023           Marcello Fennel, PA-C 07/17/20 2133    Wyvonnia Dusky, MD 07/17/20 2244

## 2020-07-17 NOTE — ED Triage Notes (Signed)
Pt reports lower back pain that started yesterday. Pt denies hx or any injury to back. Pt denies urinary symptoms.

## 2020-07-17 NOTE — Discharge Instructions (Signed)
You have been seen here for back pain.  I started you on muscle relaxers please be aware this can make you drowsy,  do not drink alcohol with this medication or operate heavy machinery.  I recommend taking over-the-counter pain medications like ibuprofen and/or Tylenol every 6 as needed.  Please follow dosage and on the back of bottle.  I also recommend applying heat to the area and stretching out the muscles as this will help decrease stiffness and pain.  I have given you information on exercises please follow.  Recommend follow-up with your PCP for further evaluation 2 weeks time if symptoms have not fully resolved.  Come back to the emergency department if you develop chest pain, shortness of breath, severe abdominal pain, uncontrolled nausea, vomiting, diarrhea.

## 2020-07-22 ENCOUNTER — Emergency Department (HOSPITAL_COMMUNITY)
Admission: EM | Admit: 2020-07-22 | Discharge: 2020-07-23 | Disposition: A | Discharge status: Home / Self Care | Payer: BC Managed Care – PPO | Attending: Emergency Medicine | Admitting: Emergency Medicine

## 2020-07-22 ENCOUNTER — Encounter (HOSPITAL_COMMUNITY): Payer: Self-pay | Admitting: Emergency Medicine

## 2020-07-22 ENCOUNTER — Other Ambulatory Visit: Payer: Self-pay

## 2020-07-22 DIAGNOSIS — R519 Headache, unspecified: Secondary | ICD-10-CM | POA: Diagnosis not present

## 2020-07-22 DIAGNOSIS — G43809 Other migraine, not intractable, without status migrainosus: Secondary | ICD-10-CM | POA: Diagnosis not present

## 2020-07-22 DIAGNOSIS — I10 Essential (primary) hypertension: Secondary | ICD-10-CM | POA: Diagnosis not present

## 2020-07-22 LAB — COMPREHENSIVE METABOLIC PANEL
ALT: 15 U/L (ref 0–44)
AST: 14 U/L — ABNORMAL LOW (ref 15–41)
Albumin: 3.3 g/dL — ABNORMAL LOW (ref 3.5–5.0)
Alkaline Phosphatase: 52 U/L (ref 38–126)
Anion gap: 9 (ref 5–15)
BUN: 15 mg/dL (ref 6–20)
CO2: 28 mmol/L (ref 22–32)
Calcium: 8.4 mg/dL — ABNORMAL LOW (ref 8.9–10.3)
Chloride: 101 mmol/L (ref 98–111)
Creatinine, Ser: 0.97 mg/dL (ref 0.44–1.00)
GFR, Estimated: >60 mL/min (ref >60)
Glucose, Bld: 114 mg/dL — ABNORMAL HIGH (ref 70–99)
Potassium: 4 mmol/L (ref 3.5–5.1)
Sodium: 138 mmol/L (ref 135–145)
Total Bilirubin: 0.2 mg/dL — ABNORMAL LOW (ref 0.3–1.2)
Total Protein: 7.3 g/dL (ref 6.5–8.1)

## 2020-07-22 LAB — URINALYSIS, ROUTINE W REFLEX MICROSCOPIC
Bilirubin Urine: NEGATIVE
Glucose, UA: NEGATIVE mg/dL
Hgb urine dipstick: NEGATIVE
Ketones, ur: NEGATIVE mg/dL
Leukocytes,Ua: NEGATIVE
Nitrite: NEGATIVE
Protein, ur: NEGATIVE mg/dL
Specific Gravity, Urine: 1.019 (ref 1.005–1.030)
pH: 5 (ref 5.0–8.0)

## 2020-07-22 LAB — CBC
HCT: 40.6 % (ref 36.0–46.0)
Hemoglobin: 12.4 g/dL (ref 12.0–15.0)
MCH: 26.1 pg (ref 26.0–34.0)
MCHC: 30.5 g/dL (ref 30.0–36.0)
MCV: 85.5 fL (ref 80.0–100.0)
Platelets: 320 10*3/uL (ref 150–400)
RBC: 4.75 MIL/uL (ref 3.87–5.11)
RDW: 14 % (ref 11.5–15.5)
WBC: 6.6 10*3/uL (ref 4.0–10.5)
nRBC: 0 % (ref 0.0–0.2)

## 2020-07-22 LAB — LIPASE, BLOOD: Lipase: 57 U/L — ABNORMAL HIGH (ref 11–51)

## 2020-07-22 LAB — I-STAT BETA HCG BLOOD, ED (MC, WL, AP ONLY): I-stat hCG, quantitative: <5 m[IU]/mL (ref <5)

## 2020-07-22 MED ORDER — SODIUM CHLORIDE 0.9 % IV BOLUS: 1000.0000 mL | Freq: Once | INTRAVENOUS | Status: DC

## 2020-07-22 MED ORDER — PROCHLORPERAZINE EDISYLATE 10 MG/2ML IJ SOLN: 10.0000 mg | Freq: Once | INTRAMUSCULAR | Status: DC

## 2020-07-22 MED ORDER — DIPHENHYDRAMINE HCL 50 MG/ML IJ SOLN: 25.0000 mg | Freq: Once | INTRAMUSCULAR | Status: DC

## 2020-07-22 MED ORDER — ONDANSETRON 4 MG PO TBDP
4.0000 mg | ORAL_TABLET | Freq: Once | ORAL | Status: AC | PRN
  Administered 2020-07-22: 4 mg via ORAL
  Filled 2020-07-22: qty 1

## 2020-07-22 MED ORDER — KETOROLAC TROMETHAMINE 15 MG/ML IJ SOLN: 15.0000 mg | Freq: Once | INTRAMUSCULAR | Status: DC

## 2020-07-22 NOTE — Discharge Instructions (Addendum)
You were evaluated in the Emergency Department and after careful evaluation, we did not find any emergent condition requiring admission or further testing in the hospital.  Your exam/testing today is overall reassuring.  We recommend follow-up with your primary care doctor to discuss preventative migraine medications  Please return to the Emergency Department if you experience any worsening of your condition.   Thank you for allowing [place] to be a part of your care.

## 2020-07-22 NOTE — ED Triage Notes (Signed)
Pt c/o migraine, nausea, and lightheadedness since yesterday. Pt states she think's she's dehydrated bc she is unable to tolerate fluids due to nausea. Denies vomiting and vision changes.

## 2020-07-22 NOTE — ED Provider Notes (Signed)
[facility] Emergency Department Provider Note MRN:  [id number]  Arrival date & time: 07/22/20     Chief Complaint   Headache and Nausea   History of Present Illness   Chloe Castillo is a 39 y.o. year-old female with a history of hypertension, migraines presenting to the ED with chief complaint of headache.  Gradual onset headache last night, constant since that time, moderate to severe.  Worse with bright lights, worse with loud noises.  Similar to prior migraines.  Denies any numbness or weakness to the arms or legs, no recent fever.  Endorsing mild nausea.  No other complaints.  Review of Systems  A complete 10 system review of systems was obtained and all systems are negative except as noted in the HPI and PMH.   Patient's Health History    Past Medical History:  Diagnosis Date  . Fibroids   . Hypertension   . Morbidly obese ([place])     History reviewed. No pertinent surgical history.  Family History  Problem Relation Age of Onset  . Hypertension Mother   . Hypertension Father     Social History   Socioeconomic History  . Marital status: Single    Spouse name: Not on file  . Number of children: Not on file  . Years of education: Not on file  . Highest education level: Not on file  Occupational History  . Not on file  Tobacco Use  . Smoking status: Never Smoker  . Smokeless tobacco: Never Used  Vaping Use  . Vaping Use: Never used  Substance and Sexual Activity  . Alcohol use: No  . Drug use: No  . Sexual activity: Never  Other Topics Concern  . Not on file  Social History Narrative  . Not on file   Social Determinants of Health   Financial Resource Strain:   . Difficulty of Paying Living Expenses: Not on file  Food Insecurity:   . Worried About Charity fundraiser in the Last Year: Not on file  . Ran Out of Food in the Last Year: Not on file  Transportation Needs:   . Lack of Transportation (Medical): Not on file  . Lack of  Transportation (Non-Medical): Not on file  Physical Activity:   . Days of Exercise per Week: Not on file  . Minutes of Exercise per Session: Not on file  Stress:   . Feeling of Stress : Not on file  Social Connections:   . Frequency of Communication with Friends and Family: Not on file  . Frequency of Social Gatherings with Friends and Family: Not on file  . Attends Religious Services: Not on file  . Active Member of Clubs or Organizations: Not on file  . Attends Archivist Meetings: Not on file  . Marital Status: Not on file  Intimate Partner Violence:   . Fear of Current or Ex-Partner: Not on file  . Emotionally Abused: Not on file  . Physically Abused: Not on file  . Sexually Abused: Not on file     Physical Exam   Vitals:   07/22/20 1646 07/22/20 2023  BP: (!) 163/153 (!) 165/113  Pulse: 72 80  Resp: 16 16  Temp:    SpO2: 97% 96%    CONSTITUTIONAL: Well-appearing, NAD NEURO:  Alert and oriented x 3, normal and symmetric strength and sensation, normal coordination, normal speech EYES:  eyes equal and reactive ENT/NECK:  no LAD, no JVD CARDIO: Regular rate, well-perfused, normal S1 and  S2 PULM:  CTAB no wheezing or rhonchi GI/GU:  normal bowel sounds, non-distended, non-tender MSK/SPINE:  No gross deformities, no edema SKIN:  no rash, atraumatic  PSYCH:  Appropriate speech and behavior  *Additional and/or pertinent findings included in MDM below  Diagnostic and Interventional Summary    EKG Interpretation  Date/Time:    Ventricular Rate:    PR Interval:    QRS Duration:   QT Interval:    QTC Calculation:   R Axis:     Text Interpretation:        Labs Reviewed  LIPASE, BLOOD - Abnormal; Notable for the following components:      Result Value   Lipase 57 (*)    All other components within normal limits  COMPREHENSIVE METABOLIC PANEL - Abnormal; Notable for the following components:   Glucose, Bld 114 (*)    Calcium 8.4 (*)    Albumin 3.3 (*)     AST 14 (*)    Total Bilirubin 0.2 (*)    All other components within normal limits  CBC  URINALYSIS, ROUTINE W REFLEX MICROSCOPIC  I-STAT BETA HCG BLOOD, ED ([facility], [facility], [facility] ONLY)    No orders to display    Medications  diphenhydrAMINE (BENADRYL) injection 25 mg (has no administration in time range)  prochlorperazine (COMPAZINE) injection 10 mg (has no administration in time range)  ketorolac (TORADOL) 15 MG/ML injection 15 mg (has no administration in time range)  sodium chloride 0.9 % bolus 1,000 mL (has no administration in time range)  ondansetron (ZOFRAN-ODT) disintegrating tablet 4 mg (4 mg Oral Given 07/22/20 1645)     Procedures  /  Critical Care Procedures  ED Course and Medical Decision Making  I have reviewed the triage vital signs, the nursing notes, and pertinent available records from the EMR.  Listed above are laboratory and imaging tests that I personally ordered, reviewed, and interpreted and then considered in my medical decision making (see below for details).  History and physical seem consistent with an uncomplicated migraine, providing migraine cocktail and will reassess.  Currently without indication for CNS imaging.  Labs performed in triage are reassuring.  Signed out to oncoming provider at shift change.  Anticipating discharge       Barth Kirks. Sedonia Small, Gay [email or web address]  Final Clinical Impressions(s) / ED Diagnoses     ICD-10-CM   1. Other migraine without status migrainosus, not intractable  G43.809     ED Discharge Orders    None       Discharge Instructions Discussed with and Provided to Patient:     Discharge Instructions     You were evaluated in the Emergency Department and after careful evaluation, we did not find any emergent condition requiring admission or further testing in the hospital.  Your exam/testing today is overall reassuring.  We recommend follow-up with your  primary care doctor to discuss preventative migraine medications  Please return to the Emergency Department if you experience any worsening of your condition.   Thank you for allowing [place] to be a part of your care.       Maudie Flakes, MD 07/22/20 [phone]

## 2020-07-23 MED ORDER — HALOPERIDOL LACTATE 5 MG/ML IJ SOLN
5.0000 mg | Freq: Once | INTRAMUSCULAR | Status: AC
  Administered 2020-07-23: 5 mg via INTRAMUSCULAR
  Filled 2020-07-23: qty 1

## 2020-07-23 MED ORDER — KETOROLAC TROMETHAMINE 30 MG/ML IJ SOLN
30.0000 mg | Freq: Once | INTRAMUSCULAR | Status: AC
  Administered 2020-07-23: 30 mg via INTRAMUSCULAR
  Filled 2020-07-23: qty 1

## 2020-07-23 NOTE — ED Provider Notes (Signed)
I assumed care of this patient.  Please see previous provider note for further details of Hx, PE.  Briefly patient is a 39 y.o. female who presented with migraine headache pending reassessment s/p cocktail.  Pain improved.  The patient appears reasonably screened and/or stabilized for discharge and I doubt any other medical condition or other [facility] requiring further screening, evaluation, or treatment in the ED at this time prior to discharge. Safe for discharge with strict return precautions.  Disposition: Discharge  Condition: Good  I have discussed the results, Dx and Tx plan with the patient/family who expressed understanding and agree(s) with the plan. Discharge instructions discussed at length. The patient/family was given strict return precautions who verbalized understanding of the instructions. No further questions at time of discharge.    ED Discharge Orders    None      Follow Up: Vania Rea, [address] [place] [id number] [phone]  Call  To schedule an appointment for close follow up          Denario Bagot, Grayce Sessions, MD 07/23/20 [phone]

## 2020-11-17 ENCOUNTER — Ambulatory Visit
Admission: EM | Admit: 2020-11-17 | Discharge: 2020-11-17 | Disposition: A | Discharge status: Home / Self Care | Payer: No Typology Code available for payment source | Attending: Emergency Medicine | Admitting: Emergency Medicine

## 2020-11-17 ENCOUNTER — Other Ambulatory Visit: Payer: Self-pay

## 2020-11-17 DIAGNOSIS — G43009 Migraine without aura, not intractable, without status migrainosus: Secondary | ICD-10-CM | POA: Diagnosis not present

## 2020-11-17 DIAGNOSIS — M25552 Pain in left hip: Secondary | ICD-10-CM

## 2020-11-17 DIAGNOSIS — R1032 Left lower quadrant pain: Secondary | ICD-10-CM

## 2020-11-17 LAB — POCT URINALYSIS DIP (MANUAL ENTRY)
Bilirubin, UA: NEGATIVE
Blood, UA: NEGATIVE
Glucose, UA: NEGATIVE mg/dL
Ketones, POC UA: NEGATIVE mg/dL
Leukocytes, UA: NEGATIVE
Nitrite, UA: NEGATIVE
Protein Ur, POC: NEGATIVE mg/dL
Spec Grav, UA: 1.025 (ref 1.010–1.025)
Urobilinogen, UA: 0.2 E.U./dL
pH, UA: 7 (ref 5.0–8.0)

## 2020-11-17 LAB — POCT URINE PREGNANCY: Preg Test, Ur: NEGATIVE

## 2020-11-17 MED ORDER — KETOROLAC TROMETHAMINE 60 MG/2ML IM SOLN
60.0000 mg | Freq: Once | INTRAMUSCULAR | Status: AC
  Administered 2020-11-17: 60 mg via INTRAMUSCULAR

## 2020-11-17 MED ORDER — NAPROXEN 500 MG PO TABS: 500.0000 mg | ORAL_TABLET | Freq: Two times a day (BID) | ORAL | 0 refills | Status: DC

## 2020-11-17 NOTE — Discharge Instructions (Signed)
I am hopeful the toradol given today helps with your pain and headache.  Your urine is normal today which is reassuring.  The pain you are describing may be related to your hip, vs female organs vs stool burden (constipation). Ensure you have regular bowel movements. Follow up with gynecology and/or orthopedics if pain persists as you may need further evaluation.  You may start naproxen twice a day as needed for pain, take with food, don't start until tonight due to the medication we have given you today.

## 2020-11-17 NOTE — ED Triage Notes (Signed)
Pt c/o migraine with light sensitivity x2 days. Pt c/o pain between lt hip and upper lt leg x2wks with no injury.

## 2020-11-17 NOTE — ED Provider Notes (Signed)
[facility]    CSN: [id number] Arrival date & time: 11/17/20  0086      History   Chief Complaint Chief Complaint  Patient presents with  . Migraine    HPI Chloe Castillo is a 40 y.o. female.   Chloe Castillo presents with complaints of migraine headache which started yesterday. Light and sound sensitivity. No nausea or vomiting. Similar to previous headaches. Tylenol hasn't helped with pain. Also complaints of left pelvic/ hip pain which started around 2 weeks ago. She wakes with pain, and then once she is more active throughout the day it improves. Hasn't taken any medication for symptoms. Mild back pain. She works at [employer] so is on her feet a lot. No numbness tingling or extremity weakness. No urinary symptoms. Hasn't taken any medications for symptoms. No known injury. Has had irregular periods, this is not necessarily new for her, however. She is sexually active  And is not on birth control. She doesn't follow with gynecology currently.     ROS per HPI, negative if not otherwise mentioned.      Past Medical History:  Diagnosis Date  . Fibroids   . Hypertension   . Morbidly obese [facility])     Patient Active Problem List   Diagnosis Date Noted  . Fibroids 01/02/2019  . BMI 50.0-59.9, adult ([place]) 11/04/2018  . Irregular periods/menstrual cycles 11/04/2018  . Seasonal allergies 11/04/2018  . Bilateral foot pain 10/01/2018  . Pre-diabetes 05/06/2018  . Elevated blood pressure reading without diagnosis of hypertension 12/24/2016  . Hx of migraine headaches 12/24/2016  . Class 3 severe obesity due to excess calories with serious comorbidity and body mass index (BMI) of 50.0 to 59.9 in adult [facility]) 12/24/2016    History reviewed. No pertinent surgical history.  OB History   No obstetric history on file.      Home Medications    Prior to Admission medications   Medication Sig Start Date End Date Taking? Authorizing Provider  naproxen (NAPROSYN) 500 MG  tablet Take 1 tablet (500 mg total) by mouth 2 (two) times daily. 11/17/20  Yes Augusto Gamble B, NP  acetaminophen (TYLENOL) 500 MG tablet Take 1,000 mg by mouth every 6 (six) hours as needed (headache).    [provider]    Family History Family History  Problem Relation Age of Onset  . Hypertension Mother   . Hypertension Father     Social History Social History   Tobacco Use  . Smoking status: Never Smoker  . Smokeless tobacco: Never Used  Vaping Use  . Vaping Use: Never used  Substance Use Topics  . Alcohol use: No  . Drug use: No     Allergies   Patient has no known allergies.   Review of Systems Review of Systems   Physical Exam Triage Vital Signs ED Triage Vitals [11/17/20 0925]  Enc Vitals Group     BP (!) 160/102     Pulse Rate 85     Resp 18     Temp 98.5 F (36.9 C)     Temp Source Oral     SpO2 95 %     Weight      Height      Head Circumference      Peak Flow      Pain Score 8     Pain Loc      Pain Edu?      Excl. in [place]?    No data found.  Updated  Vital Signs BP (!) 160/102 (BP Location: Left Arm)   Pulse 85   Temp 98.5 F (36.9 C) (Oral)   Resp 18   LMP 10/15/2020   SpO2 95%    Physical Exam Constitutional:      General: She is not in acute distress.    Appearance: She is well-developed. She is obese.  HENT:     Head: Normocephalic and atraumatic.  Eyes:     Pupils: Pupils are equal, round, and reactive to light.  Cardiovascular:     Rate and Rhythm: Normal rate.  Pulmonary:     Effort: Pulmonary effort is normal.  Abdominal:     Tenderness: There is abdominal tenderness in the left lower quadrant. There is no right CVA tenderness, left CVA tenderness, guarding or rebound.  Musculoskeletal:        General: Normal range of motion.     Comments: Left hip is non tender and no pain with ROM of left hip and leg; mild left sided low back pain without bony tenderness; ambulatory without difficulty.   Skin:     General: Skin is warm and dry.  Neurological:     Mental Status: She is alert and oriented to person, place, and time.      UC Treatments / Results  Labs (all labs ordered are listed, but only abnormal results are displayed) Labs Reviewed  POCT URINALYSIS DIP (MANUAL ENTRY)  POCT URINE PREGNANCY    EKG   Radiology No results found.  Procedures Procedures (including critical care time)  Medications Ordered in UC Medications  ketorolac (TORADOL) injection 60 mg (60 mg Intramuscular Given 11/17/20 0958)    Initial Impression / Assessment and Plan / UC Course  I have reviewed the triage vital signs and the nursing notes.  Pertinent labs & imaging results that were available during my care of the patient were reviewed by me and considered in my medical decision making (see chart for details).     Vitals stable here today, no evidence of acute abdomen on exam. History of left sided hip pain, but on exam appears more consistent with LLQ/ pelvic pain. Arthritis still considered given the pattern of pain and improvement with activity. Obese and irregular periods, do recommend follow up with gyn if symptoms persist- ovarian cysts also considered. Stool burden further considered as well, denies any obvious constipation. Migraine treatment and prevention discussed with toradol provided here today. Patient verbalized understanding and agreeable to plan.  Ambulatory out of clinic without difficulty.    Final Clinical Impressions(s) / UC Diagnoses   Final diagnoses:  Left lower quadrant abdominal pain  Left hip pain  Migraine without aura and without status migrainosus, not intractable     Discharge Instructions     I am hopeful the toradol given today helps with your pain and headache.  Your urine is normal today which is reassuring.  The pain you are describing may be related to your hip, vs female organs vs stool burden (constipation). Ensure you have regular bowel movements.  Follow up with gynecology and/or orthopedics if pain persists as you may need further evaluation.  You may start naproxen twice a day as needed for pain, take with food, don't start until tonight due to the medication we have given you today.     ED Prescriptions    Medication Sig Dispense Auth. Provider   naproxen (NAPROSYN) 500 MG tablet Take 1 tablet (500 mg total) by mouth 2 (two) times daily. 30 tablet [place],  Malachy Moan, NP     PDMP not reviewed this encounter.   Zigmund Gottron, NP 11/17/20 1001

## 2020-12-23 ENCOUNTER — Ambulatory Visit: Payer: BC Managed Care – PPO | Admitting: Family Medicine

## 2020-12-29 IMAGING — CT CT HEAD W/O CM
4 series · 17 of 47 positions shown, 19 images · non-contrast
Comparison: None.

CLINICAL DATA: Headache, dizziness

EXAM:
CT HEAD WITHOUT CONTRAST
TECHNIQUE: Contiguous axial images were obtained from the base of the skull
through the vertex without intravenous contrast.

[Series 2: head wo · axial · 0.41mm/px · z∈[+1534,+1654]mm · 7 of 32 slices shown, 9 images]
[im 4/32  brain]
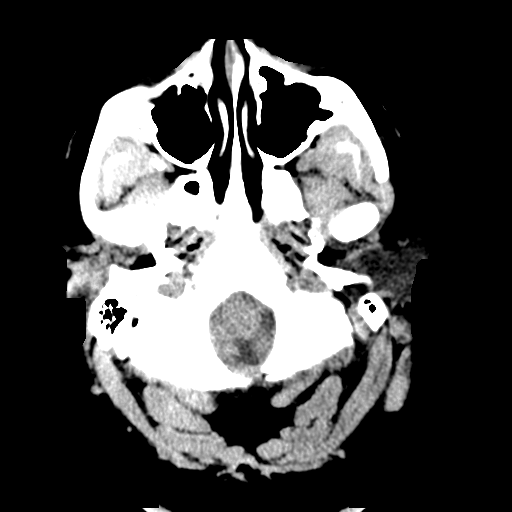
[im 4/32  bone]
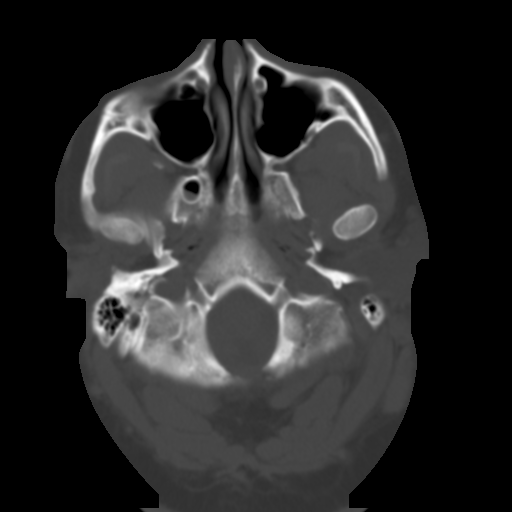
[im 8/32  brain]
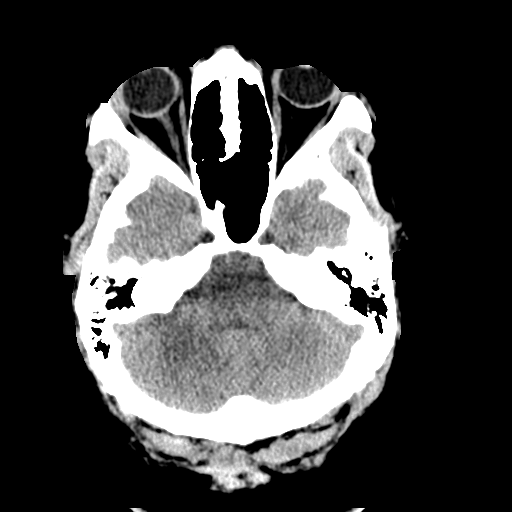
[im 12/32  brain]
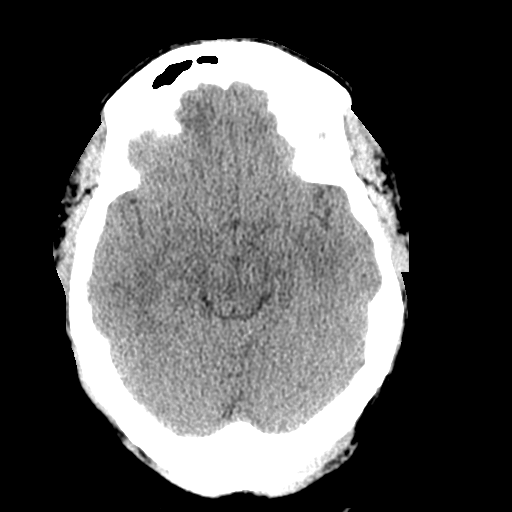
[im 16/32  brain]
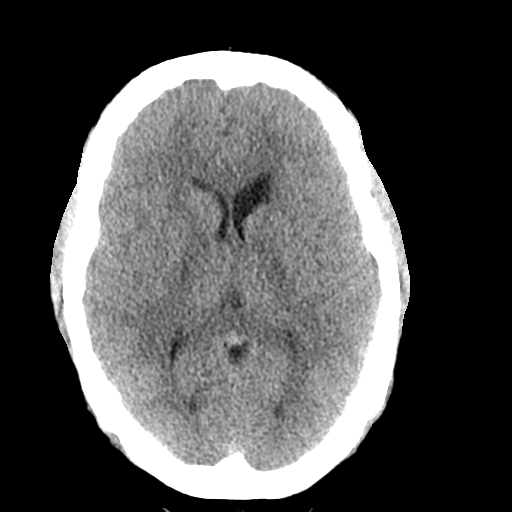
[im 20/32  brain]
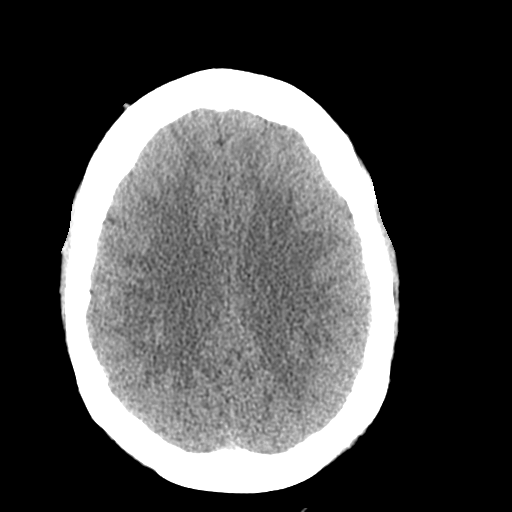
[im 20/32  bone]
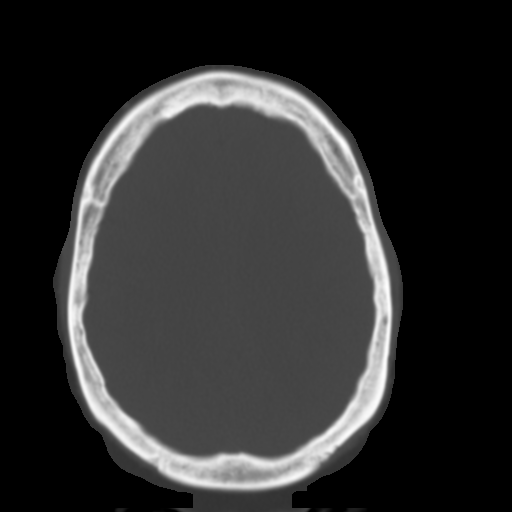
[im 24/32  brain]
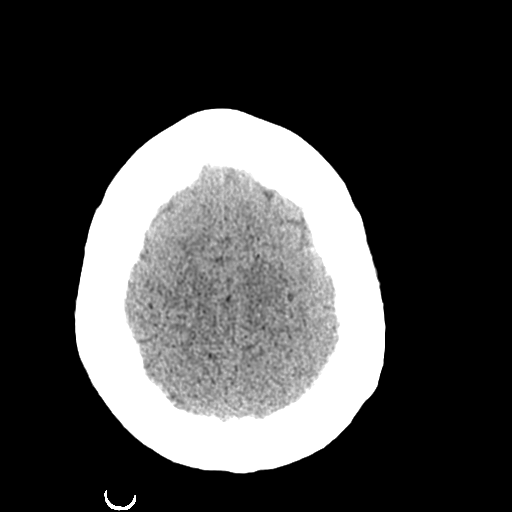
[im 28/32  brain]
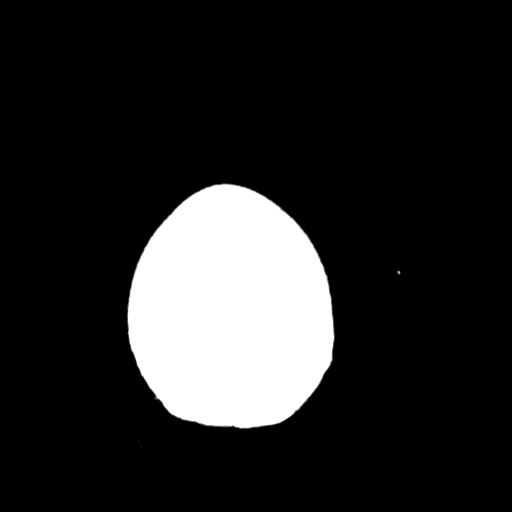

[Series 3: head bone · axial · 0.41mm/px · z∈[+1533,+1589]mm · 4 of 79 slices shown]
[im 8/79  bone]
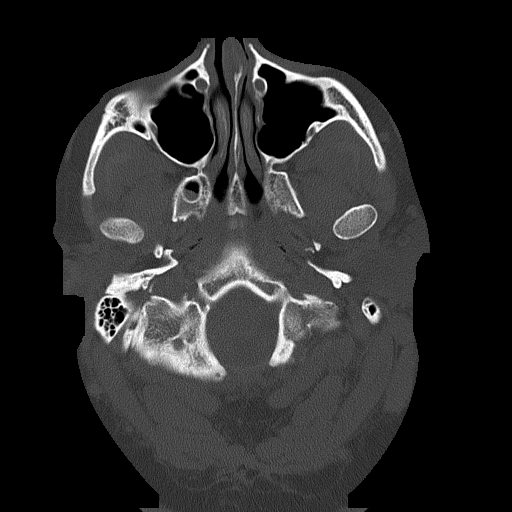
[im 16/79  bone]
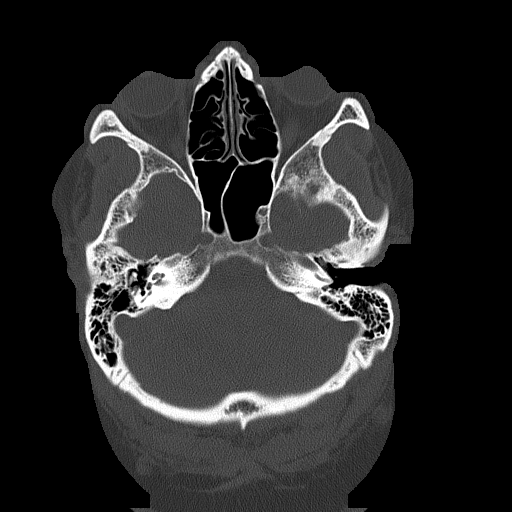
[im 24/79  bone]
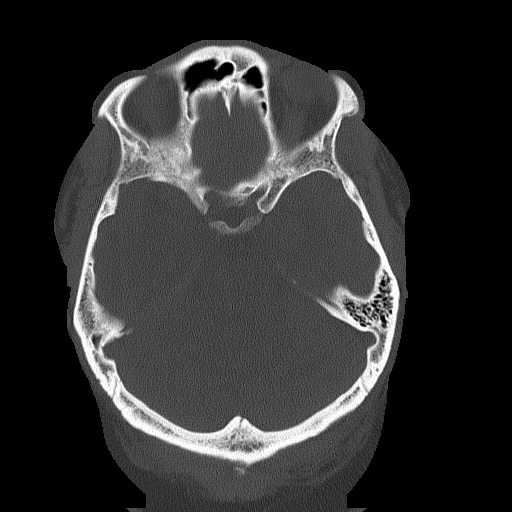
[im 36/79  bone]
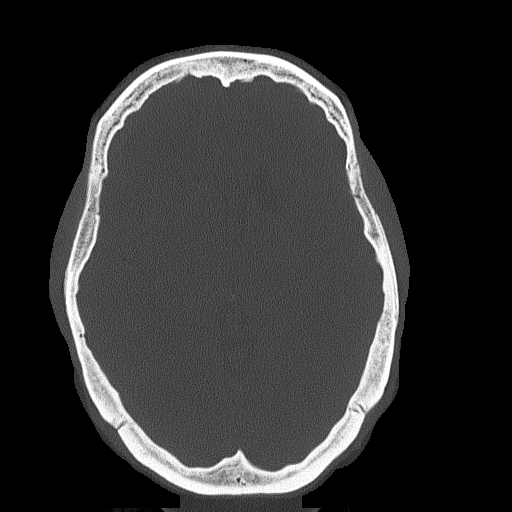

[Series 5: coronal soft tissue · coronal · 0.29mm/px · 3 of 65 slices shown]
[im 22/65  brain]
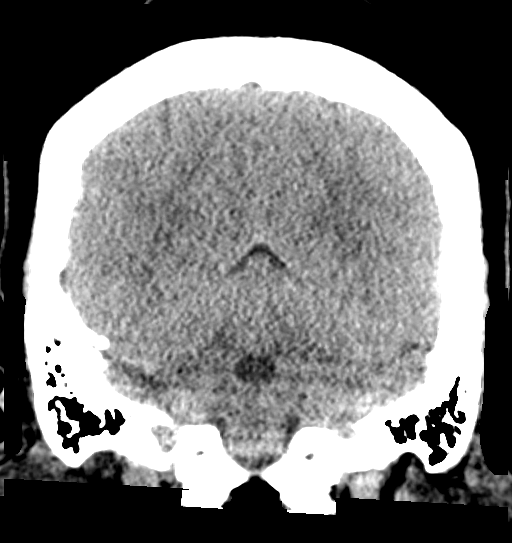
[im 29/65  brain]
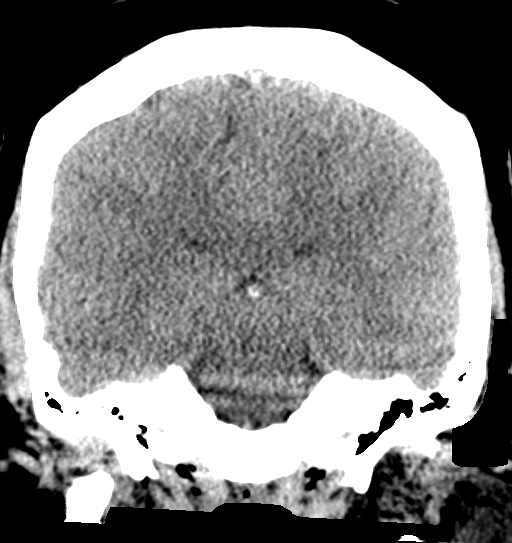
[im 36/65  brain]
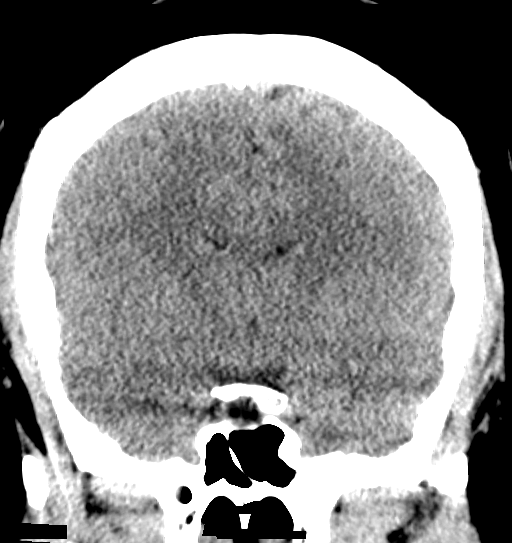

[Series 6: sagittal soft tissue · sagittal · 0.31mm/px · 3 of 50 slices shown]
[im 17/50  brain]
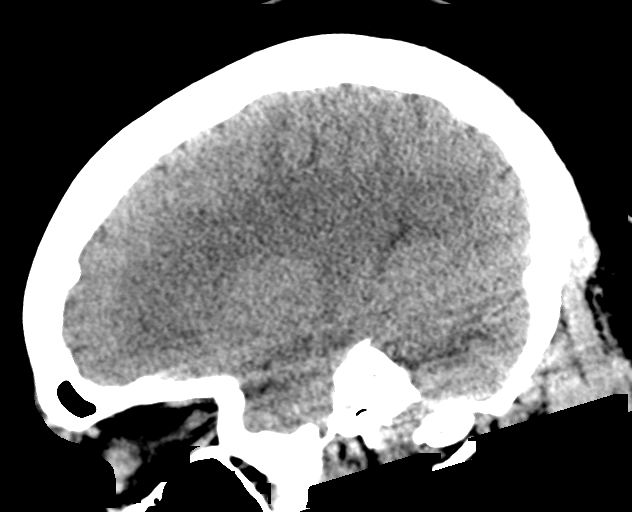
[im 25/50  brain]
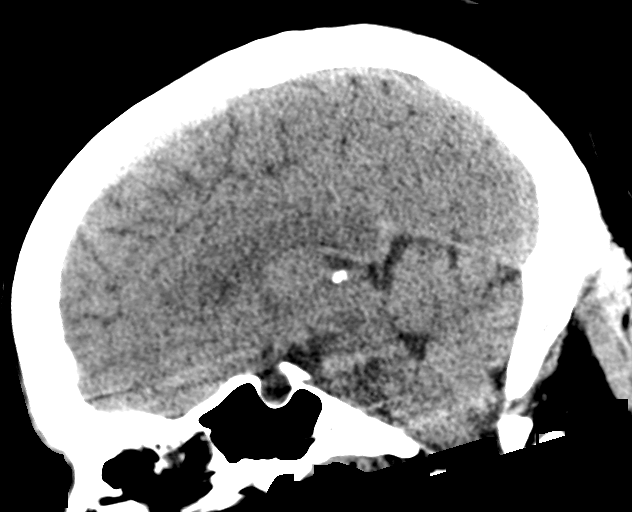
[im 33/50  brain]
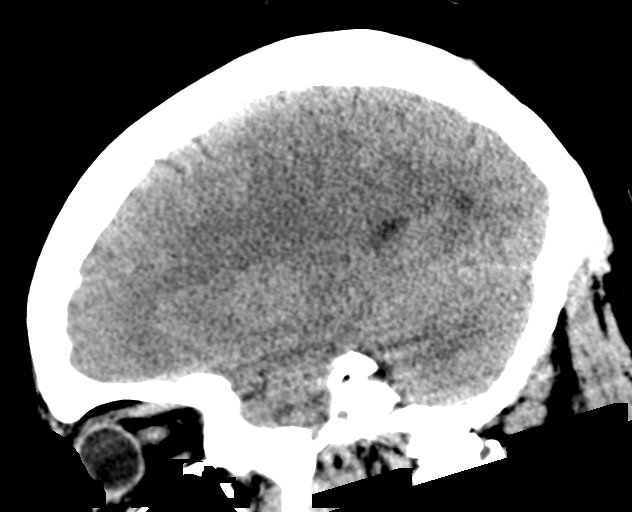

[17 of 47 positions shown; findings below may reference images not displayed]

FINDINGS: Brain: No evidence of acute infarction, hemorrhage, hydrocephalus,
extra-axial collection or mass lesion/mass effect. There is a
nonacute lacunar infarction of the left caudate (series 2, image
15).

Vascular: No hyperdense vessel or unexpected calcification.

Skull: Normal. Negative for fracture or focal lesion.

Sinuses/Orbits: No acute finding.

Other: None.
IMPRESSION: 1.  No acute intracranial pathology.

2. There appears to be a nonacute lacunar infarction of the left
caudate, unusual for patient age and of uncertain etiology. MRI may
be helpful to further evaluate.

## 2020-12-29 IMAGING — DX DG CHEST 1V PORT
2 series · 2 of 2 positions shown · non-contrast
Comparison: 01/11/2016

CLINICAL DATA: Syncopal episode

EXAM:
PORTABLE CHEST 1 VIEW

[chest ap (1 of 2)]
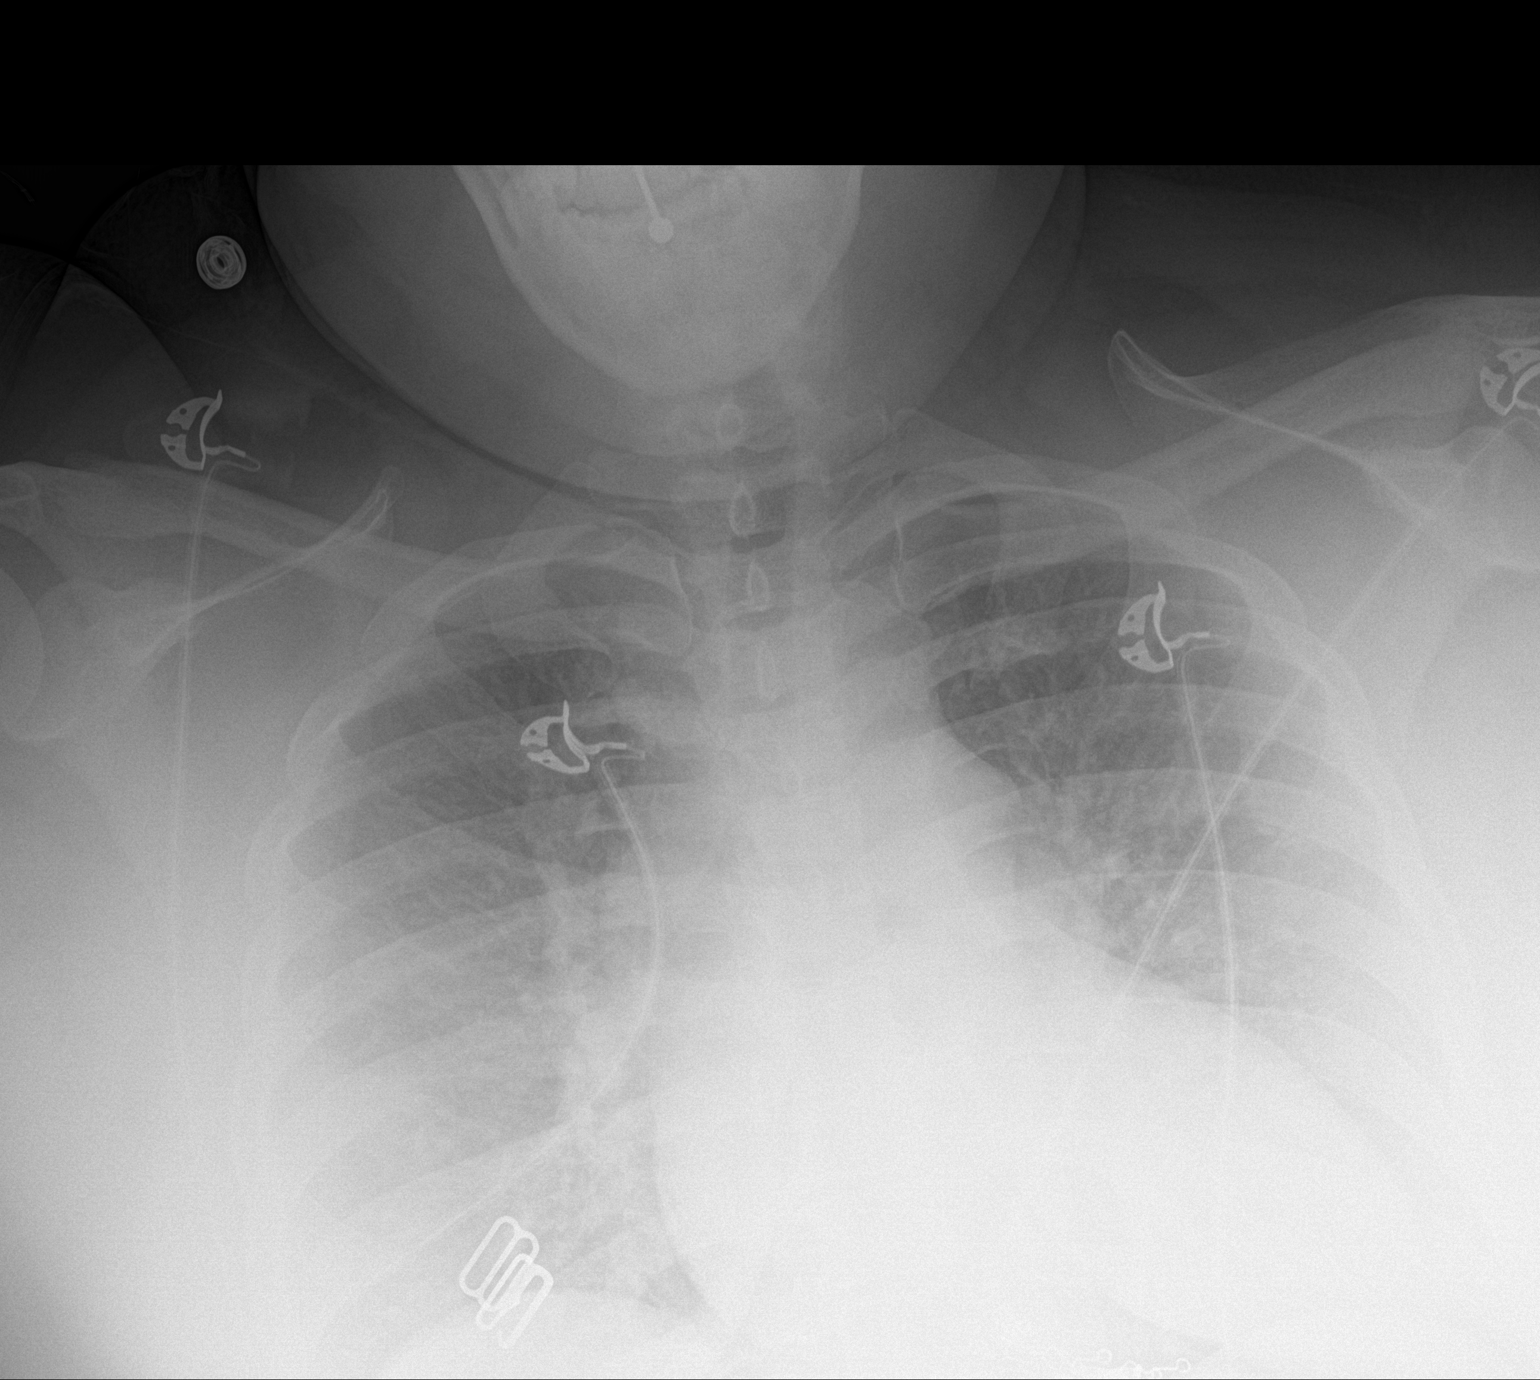

[chest ap (2 of 2)]
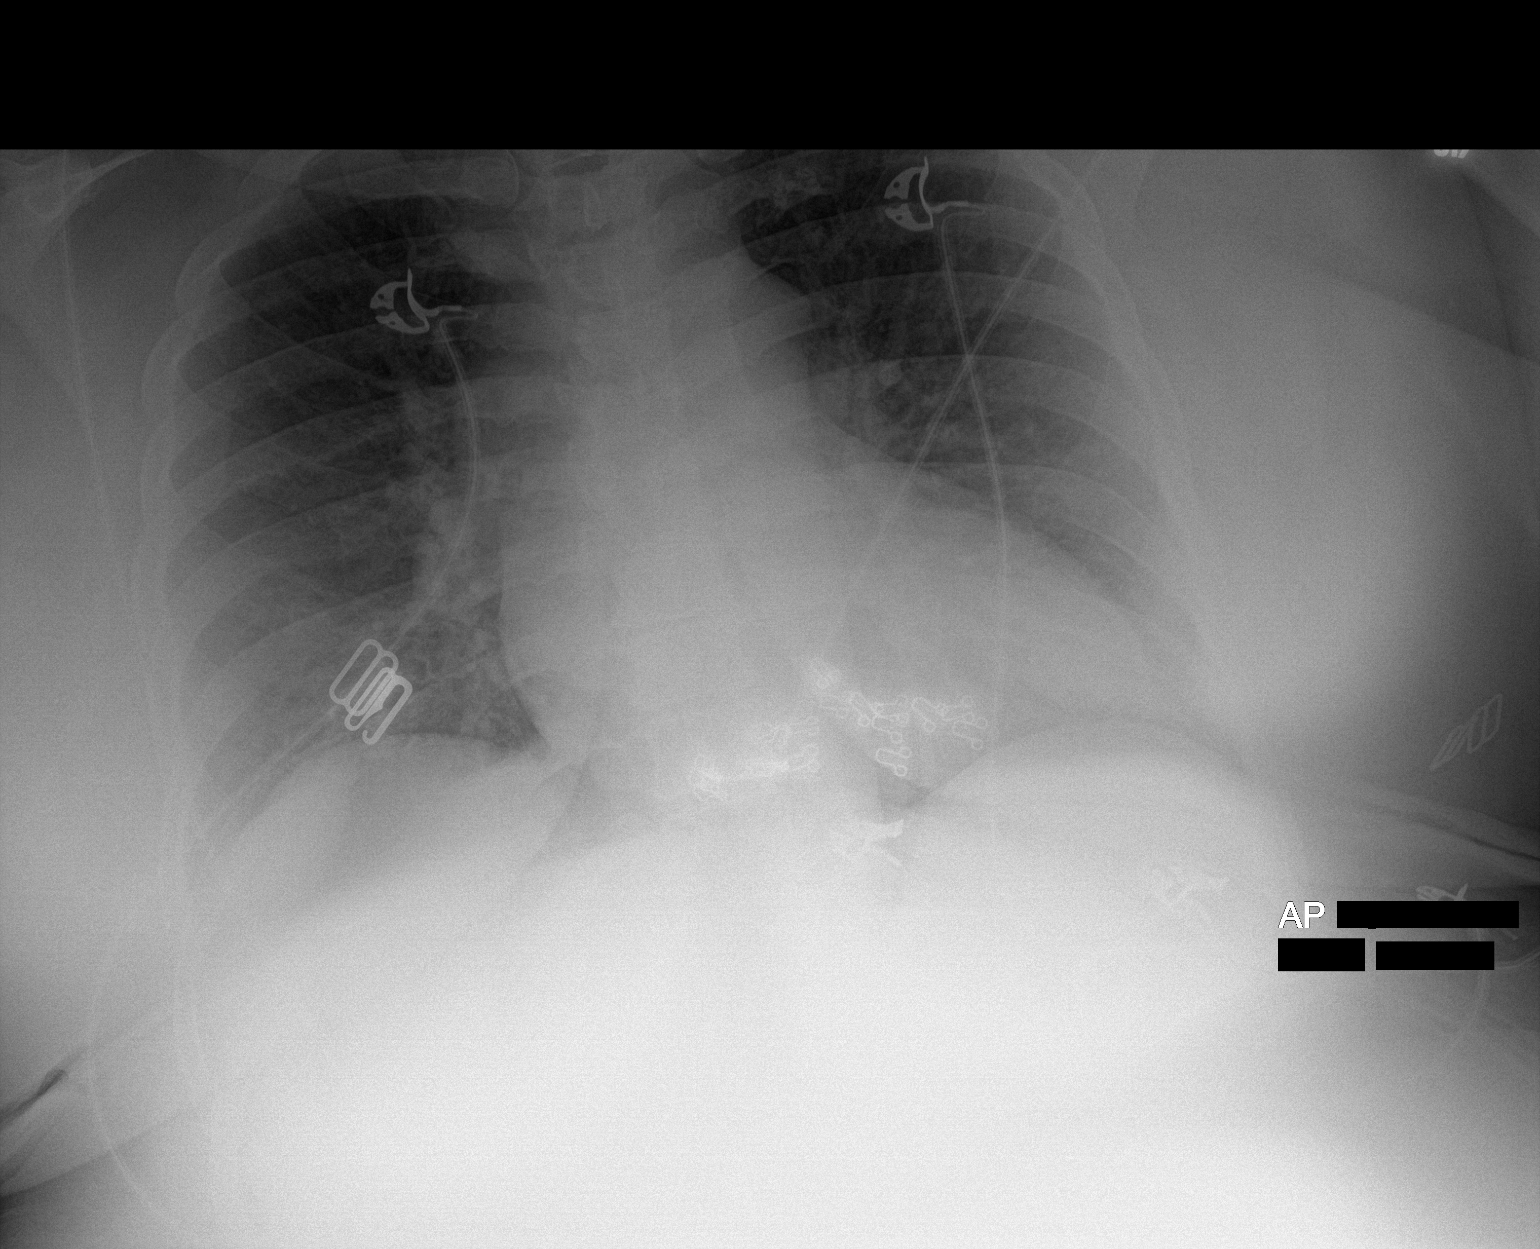

[2 of 2 positions shown; findings below may reference images not displayed]

FINDINGS: Cardiac shadow is at the upper limits of normal in size. The lungs
are well aerated bilaterally. No focal infiltrate or sizable
effusion is seen.
IMPRESSION: No acute abnormality noted.

## 2022-05-22 ENCOUNTER — Emergency Department (HOSPITAL_COMMUNITY): Payer: Medicaid Other

## 2022-05-22 ENCOUNTER — Encounter (HOSPITAL_COMMUNITY): Payer: Self-pay

## 2022-05-22 ENCOUNTER — Emergency Department (HOSPITAL_COMMUNITY)
Admission: EM | Admit: 2022-05-22 | Discharge: 2022-05-22 | Disposition: A | Discharge status: Home / Self Care | Payer: Medicaid Other | Attending: Emergency Medicine | Admitting: Emergency Medicine

## 2022-05-22 DIAGNOSIS — S39012A Strain of muscle, fascia and tendon of lower back, initial encounter: Secondary | ICD-10-CM | POA: Insufficient documentation

## 2022-05-22 DIAGNOSIS — W19XXXA Unspecified fall, initial encounter: Secondary | ICD-10-CM

## 2022-05-22 DIAGNOSIS — W01198A Fall on same level from slipping, tripping and stumbling with subsequent striking against other object, initial encounter: Secondary | ICD-10-CM | POA: Insufficient documentation

## 2022-05-22 DIAGNOSIS — M5136 Other intervertebral disc degeneration, lumbar region: Secondary | ICD-10-CM | POA: Insufficient documentation

## 2022-05-22 DIAGNOSIS — Y990 Civilian activity done for income or pay: Secondary | ICD-10-CM | POA: Insufficient documentation

## 2022-05-22 LAB — POC URINE PREG, ED: Preg Test, Ur: NEGATIVE

## 2022-05-22 MED ORDER — METHOCARBAMOL 500 MG PO TABS: 500.0000 mg | ORAL_TABLET | Freq: Two times a day (BID) | ORAL | 0 refills | Status: DC

## 2022-05-22 MED ORDER — DICLOFENAC SODIUM 1 % EX GEL: 2.0000 g | Freq: Four times a day (QID) | CUTANEOUS | 0 refills | Status: AC

## 2022-05-22 NOTE — ED Triage Notes (Signed)
Pt arrived via POV, c/o generalized body pain. States fell as work several days ago. COVID positive several wks ago.

## 2022-05-22 NOTE — ED Provider Triage Note (Signed)
Emergency Medicine Provider Triage Evaluation Note  Chloe Castillo , a 41 y.o. female  was evaluated in triage.  Pt complains of fall at work Wednesday or Thursday last week, slipped on freshly mopped floor, landing on back, hit head, no LOC, no blood thinners. Reports pain in right side and right buttocks. Ambulatory without difficulty.   Review of Systems  Positive: Right side pain Negative: Weakness, numbness  Physical Exam  BP (!) 161/112 (BP Location: Right Arm)   Pulse 62   Temp 98.4 F (36.9 C) (Oral)   Resp 18   SpO2 93%  Gen:   Awake, no distress   Resp:  Normal effort  MSK:   Moves extremities without difficulty  Other:  TTP right lumbar  Medical Decision Making  Medically screening exam initiated at 11:22 AM.  Appropriate orders placed.  Shawnette Augello was informed that the remainder of the evaluation will be completed by another provider, this initial triage assessment does not replace that evaluation, and the importance of remaining in the ED until their evaluation is complete.     Tacy Learn, PA-C 05/22/22 1124

## 2022-05-22 NOTE — Discharge Instructions (Signed)
Recheck with your primary care provider or see your Worker's Comp. provider in 2 days.  Take medications as prescribed for your back pain.  Can also apply topical lidocaine patches, these are available over-the-counter.  Recommend warm compresses for 20 minutes at a time and follow with gentle stretching.

## 2022-05-22 NOTE — ED Provider Notes (Signed)
[facility] DEPT Provider Note   CSN: [id number] Arrival date & time: 05/22/22  1045     History  Chief Complaint  Patient presents with   Pain    Chloe Castillo is a 41 y.o. female.  Pt complains of fall at work Wednesday or Thursday last week, slipped on freshly mopped floor, landing on back, hit head, no LOC, no blood thinners. Reports pain in right side and right buttocks. Ambulatory without difficulty.        Home Medications Prior to Admission medications   Medication Sig Start Date End Date Taking? Authorizing Provider  diclofenac Sodium (VOLTAREN) 1 % GEL Apply 2 g topically 4 (four) times daily. 05/22/22  Yes Tacy Learn, PA-C  methocarbamol (ROBAXIN) 500 MG tablet Take 1 tablet (500 mg total) by mouth 2 (two) times daily. 05/22/22  Yes Tacy Learn, PA-C  acetaminophen (TYLENOL) 500 MG tablet Take 1,000 mg by mouth every 6 (six) hours as needed (headache).    [provider]      Allergies    Patient has no known allergies.    Review of Systems   Review of Systems Negative except as per HPI Physical Exam Updated Vital Signs BP (!) 161/112 (BP Location: Right Arm)   Pulse 62   Temp 98.4 F (36.9 C) (Oral)   Resp 18   SpO2 93%  Physical Exam Vitals and nursing note reviewed.  Constitutional:      General: She is not in acute distress.    Appearance: She is well-developed. She is not diaphoretic.  HENT:     Head: Normocephalic and atraumatic.  Cardiovascular:     Pulses: Normal pulses.  Pulmonary:     Effort: Pulmonary effort is normal.  Musculoskeletal:        General: Tenderness present. No swelling or deformity.     Thoracic back: No tenderness or bony tenderness.     Lumbar back: Tenderness present. No bony tenderness.       Back:  Skin:    General: Skin is warm and dry.     Findings: No erythema or rash.  Neurological:     Mental Status: She is alert and oriented to person, place, and time.      Motor: No weakness.     Gait: Gait normal.  Psychiatric:        Behavior: Behavior normal.     ED Results / Procedures / Treatments   Labs (all labs ordered are listed, but only abnormal results are displayed) Labs Reviewed  POC URINE PREG, ED    EKG None  Radiology DG Lumbar Spine Complete  Result Date: 05/22/2022 CLINICAL DATA:  RIGHT-side low back pain, fell 3 days ago EXAM: LUMBAR SPINE - COMPLETE 4+ VIEW COMPARISON:  None Available. FINDINGS: Osseous mineralization normal. Five non-rib-bearing lumbar vertebra. Disc space narrowing and endplate spur formation at lower thoracic spine and at L3-L4. Vertebral body heights maintained without fracture, subluxation, or bone destruction. No spondylolysis. Mild asymmetric sclerosis at RIGHT SI joint question sacroiliitis. IMPRESSION: Degenerative disc disease changes lumbar spine without acute abnormalities. Question asymmetric RIGHT sacroiliitis. Electronically Signed   By: Lavonia Dana M.D.   On: 05/22/2022 12:00    Procedures Procedures    Medications Ordered in ED Medications - No data to display  ED Course/ Medical Decision Making/ A&P  Medical Decision Making Amount and/or Complexity of Data Reviewed Radiology: ordered.   41 year old female with right lower back pain after a slip and fall at work 1 week ago as above.  She is ambulatory, has tenderness in her right lumbar paraspinous extending to her right SI.  Leg strength symmetric, sensation intact, DP pulses present.  Plan is to treat with topical diclofenac (HTN, avoiding oral NSAIDs), Robaxin, recommend if this is a workers comp injury that she follow-up with her Worker's Comp. provider in the next 2 days for recheck and reevaluation.  Otherwise, recheck with primary care provider.  hCG negative.  X-ray lumbar spine reviewed, degenerative changes of the lumbar spine.  Radiology questions right sacroiliitis.  Agree with  interpretation.        Final Clinical Impression(s) / ED Diagnoses Final diagnoses:  Fall, initial encounter  Strain of lumbar region, initial encounter  Degenerative disc disease, lumbar    Rx / DC Orders ED Discharge Orders          Ordered    diclofenac Sodium (VOLTAREN) 1 % GEL  4 times daily        05/22/22 1206    methocarbamol (ROBAXIN) 500 MG tablet  2 times daily        05/22/22 1206              Tacy Learn, PA-C 05/22/22 1214    Godfrey Pick, MD 05/22/22 1736

## 2022-10-12 DIAGNOSIS — Z124 Encounter for screening for malignant neoplasm of cervix: Secondary | ICD-10-CM | POA: Diagnosis not present

## 2022-10-12 DIAGNOSIS — Z0000 Encounter for general adult medical examination without abnormal findings: Secondary | ICD-10-CM | POA: Diagnosis not present

## 2023-09-01 DIAGNOSIS — Z20822 Contact with and (suspected) exposure to covid-19: Secondary | ICD-10-CM | POA: Diagnosis not present

## 2023-09-01 DIAGNOSIS — R5383 Other fatigue: Secondary | ICD-10-CM | POA: Diagnosis not present

## 2023-09-01 DIAGNOSIS — J988 Other specified respiratory disorders: Secondary | ICD-10-CM | POA: Diagnosis not present

## 2023-09-01 DIAGNOSIS — J101 Influenza due to other identified influenza virus with other respiratory manifestations: Secondary | ICD-10-CM | POA: Diagnosis not present

## 2023-09-01 DIAGNOSIS — B9789 Other viral agents as the cause of diseases classified elsewhere: Secondary | ICD-10-CM | POA: Diagnosis not present

## 2023-09-02 ENCOUNTER — Emergency Department (HOSPITAL_BASED_OUTPATIENT_CLINIC_OR_DEPARTMENT_OTHER): Payer: 59

## 2023-09-02 ENCOUNTER — Encounter (HOSPITAL_BASED_OUTPATIENT_CLINIC_OR_DEPARTMENT_OTHER): Payer: Self-pay | Admitting: Emergency Medicine

## 2023-09-02 ENCOUNTER — Other Ambulatory Visit: Payer: Self-pay

## 2023-09-02 ENCOUNTER — Emergency Department (HOSPITAL_BASED_OUTPATIENT_CLINIC_OR_DEPARTMENT_OTHER)
Admission: EM | Admit: 2023-09-02 | Discharge: 2023-09-02 | Disposition: A | Discharge status: Home / Self Care | Payer: 59 | Attending: Emergency Medicine | Admitting: Emergency Medicine

## 2023-09-02 DIAGNOSIS — J101 Influenza due to other identified influenza virus with other respiratory manifestations: Secondary | ICD-10-CM | POA: Diagnosis not present

## 2023-09-02 DIAGNOSIS — J111 Influenza due to unidentified influenza virus with other respiratory manifestations: Secondary | ICD-10-CM | POA: Insufficient documentation

## 2023-09-02 DIAGNOSIS — R059 Cough, unspecified: Secondary | ICD-10-CM | POA: Diagnosis not present

## 2023-09-02 DIAGNOSIS — Z79899 Other long term (current) drug therapy: Secondary | ICD-10-CM | POA: Insufficient documentation

## 2023-09-02 DIAGNOSIS — I10 Essential (primary) hypertension: Secondary | ICD-10-CM | POA: Diagnosis not present

## 2023-09-02 NOTE — ED Notes (Signed)
 Pt alert and oriented X 4 at the time of discharge. RR even and unlabored. No acute distress noted. Pt verbalized understanding of discharge instructions as discussed. Pt ambulatory to lobby at time of discharge.

## 2023-09-02 NOTE — ED Provider Notes (Addendum)
 [place] EMERGENCY DEPARTMENT AT [facility] Provider Note   CSN: [id number] Arrival date & time: 09/02/23  1001     History  Chief Complaint  Patient presents with   Cough    Chloe Castillo is a 43 y.o. female.  Patient came down with flulike symptoms on Thursday.  Seen in urgent care yesterday formal diagnosis of influenza A.  Patient prescribed Tamiflu which she did not get that filled.  Also had a refill on her blood pressure medicines.  She does have a history of hypertension and obesity.  Patient is never used tobacco products.  Patient states now when she coughs she gets left anterior lower rib pain.  She came here for chest x-ray.  Patient is complaints of bodyaches headache cough congestion fevers.  Last took Motrin  around 5 or 6 AM.       Home Medications Prior to Admission medications   Medication Sig Start Date End Date Taking? Authorizing Provider  acetaminophen  (TYLENOL ) 500 MG tablet Take 1,000 mg by mouth every 6 (six) hours as needed (headache).    [provider]  diclofenac  Sodium (VOLTAREN ) 1 % GEL Apply 2 g topically 4 (four) times daily. 05/22/22   Beverley Leita LABOR, PA-C  methocarbamol  (ROBAXIN ) 500 MG tablet Take 1 tablet (500 mg total) by mouth 2 (two) times daily. 05/22/22   Beverley Leita LABOR, PA-C      Allergies    Patient has no known allergies.    Review of Systems   Review of Systems  Constitutional:  Positive for fever. Negative for chills.  HENT:  Positive for congestion. Negative for ear pain and sore throat.   Eyes:  Negative for pain and visual disturbance.  Respiratory:  Positive for cough. Negative for shortness of breath.   Cardiovascular:  Negative for chest pain and palpitations.  Gastrointestinal:  Negative for abdominal pain and vomiting.  Genitourinary:  Negative for dysuria and hematuria.  Musculoskeletal:  Positive for myalgias. Negative for arthralgias and back pain.  Skin:  Negative for color change and rash.   Neurological:  Positive for headaches. Negative for seizures and syncope.  All other systems reviewed and are negative.   Physical Exam Updated Vital Signs BP (!) 158/107 (BP Location: Right Wrist)   Pulse 80   Temp 98.9 F (37.2 C) (Oral)   Resp (!) 22   Ht 1.626 m (5' 4)   Wt (!) 145.2 kg   LMP 08/05/2023 (Exact Date)   SpO2 94%   BMI 54.93 kg/m  Physical Exam Vitals and nursing note reviewed.  Constitutional:      General: She is not in acute distress.    Appearance: Normal appearance. She is well-developed.  HENT:     Head: Normocephalic and atraumatic.  Eyes:     Extraocular Movements: Extraocular movements intact.     Conjunctiva/sclera: Conjunctivae normal.     Pupils: Pupils are equal, round, and reactive to light.  Cardiovascular:     Rate and Rhythm: Normal rate and regular rhythm.     Heart sounds: No murmur heard. Pulmonary:     Effort: Pulmonary effort is normal. No respiratory distress.     Breath sounds: Normal breath sounds.  Abdominal:     Palpations: Abdomen is soft.     Tenderness: There is no abdominal tenderness.  Musculoskeletal:        General: No swelling.     Cervical back: Neck supple.  Skin:    General: Skin is warm and dry.  Capillary Refill: Capillary refill takes less than 2 seconds.  Neurological:     General: No focal deficit present.     Mental Status: She is alert and oriented to person, place, and time.  Psychiatric:        Mood and Affect: Mood normal.     ED Results / Procedures / Treatments   Labs (all labs ordered are listed, but only abnormal results are displayed) Labs Reviewed - No data to display  EKG None  Radiology No results found.  Procedures Procedures    Medications Ordered in ED Medications - No data to display  ED Course/ Medical Decision Making/ A&P                                 Medical Decision Making Amount and/or Complexity of Data Reviewed Radiology: ordered.   Patient already  diagnosed with influenza A.  Patient is concerned about the left rib pain with coughing.  Came here for chest x-ray.  Will get portable chest x-ray.  Patient's oxygen saturations are reassuring 94% on room air.  Patient's blood pressure is elevated 158/107 but she did not take any of her blood pressure medicine.  Temp here 98.9.  Heart rate 80 respirations 22.  Patient symptoms seem to be consistent with influenza A.  Sounds as if she does not want to take the Tamiflu.  That is up to her.  Will get portable chest x-ray here for further evaluation.  Chest x-ray without any acute findings.  Clinically we know patient has influenza A.  Recommend symptomatic treatment.  Work note provided.  Final Clinical Impression(s) / ED Diagnoses Final diagnoses:  Influenza A    Rx / DC Orders ED Discharge Orders     None         Geraldene Hamilton, MD 09/02/23 1040    Geraldene Hamilton, MD 09/02/23 [phone]

## 2023-09-02 NOTE — ED Triage Notes (Signed)
 Pt caox4, ambulatory, NAD c/o pain around anterior rib area when coughing. Pt reports cough since Friday, flu+ at [facility] yesterday, did not have CXR done. Pt reports fever at home, last took motrin around 5-6a, afebrile at present.

## 2023-09-02 NOTE — Discharge Instructions (Addendum)
 Chest x-ray without any acute findings.  Recommend symptomatic treatment for the influenza.  Tamiflu is optional for you.  Definitely start taking your blood pressure medicines.  Make an appointment to follow-up with your primary care doctor this week sometime.

## 2023-09-09 ENCOUNTER — Emergency Department (HOSPITAL_BASED_OUTPATIENT_CLINIC_OR_DEPARTMENT_OTHER): Payer: 59 | Admitting: Radiology

## 2023-09-09 ENCOUNTER — Emergency Department (HOSPITAL_BASED_OUTPATIENT_CLINIC_OR_DEPARTMENT_OTHER)
Admission: EM | Admit: 2023-09-09 | Discharge: 2023-09-09 | Disposition: A | Discharge status: Home / Self Care | Payer: 59 | Attending: Emergency Medicine | Admitting: Emergency Medicine

## 2023-09-09 ENCOUNTER — Encounter (HOSPITAL_BASED_OUTPATIENT_CLINIC_OR_DEPARTMENT_OTHER): Payer: Self-pay

## 2023-09-09 ENCOUNTER — Emergency Department (HOSPITAL_BASED_OUTPATIENT_CLINIC_OR_DEPARTMENT_OTHER): Payer: 59

## 2023-09-09 DIAGNOSIS — R059 Cough, unspecified: Secondary | ICD-10-CM | POA: Diagnosis present

## 2023-09-09 DIAGNOSIS — R0602 Shortness of breath: Secondary | ICD-10-CM | POA: Diagnosis not present

## 2023-09-09 DIAGNOSIS — B349 Viral infection, unspecified: Secondary | ICD-10-CM | POA: Diagnosis not present

## 2023-09-09 DIAGNOSIS — R058 Other specified cough: Secondary | ICD-10-CM

## 2023-09-09 MED ORDER — BENZONATATE 100 MG PO CAPS: 100.0000 mg | ORAL_CAPSULE | Freq: Three times a day (TID) | ORAL | 0 refills | Status: DC

## 2023-09-09 NOTE — ED Triage Notes (Signed)
She states she tested positive for flu about a week ago. She is here with c/o her flu sx are persistent in spite of being prescribed and taking Tamiflu.

## 2023-09-09 NOTE — Discharge Instructions (Signed)
While you are in the emergency room you have a chest x-ray that was normal.  Like we discussed, these viral symptoms can often take 2 to 3 weeks before they fully go away.  Some of the things to watch out for would be a fever, or difficulty with your breathing.  You can use Tessalon Perles for cough.  Follow-up with your primary care doctor within 1 week.

## 2023-09-09 NOTE — ED Provider Notes (Signed)
[place] EMERGENCY DEPARTMENT AT [facility] Provider Note   CSN: [id number] Arrival date & time: 09/09/23  4098     History  Chief Complaint  Patient presents with   Influenza    Chloe Castillo is a 43 y.o. female.  43 year old female here today with cough.  She was diagnosed with flu 1 week ago.  She still has cough.  She has not had fever.  She took Tamiflu.   Influenza      Home Medications Prior to Admission medications   Medication Sig Start Date End Date Taking? Authorizing Provider  benzonatate (TESSALON) 100 MG capsule Take 1 capsule (100 mg total) by mouth every 8 (eight) hours. 09/09/23  Yes Anders Simmonds T, DO  acetaminophen (TYLENOL) 500 MG tablet Take 1,000 mg by mouth every 6 (six) hours as needed (headache).    [provider]  diclofenac Sodium (VOLTAREN) 1 % GEL Apply 2 g topically 4 (four) times daily. 05/22/22   Jeannie Fend, PA-C  methocarbamol (ROBAXIN) 500 MG tablet Take 1 tablet (500 mg total) by mouth 2 (two) times daily. 05/22/22   Jeannie Fend, PA-C      Allergies    Patient has no known allergies.    Review of Systems   Review of Systems  Physical Exam Updated Vital Signs BP (!) 164/104 (BP Location: Right Wrist)   Pulse 84   Temp 98.8 F (37.1 C) (Oral)   Resp 20   LMP 09/05/2023 (Exact Date)   SpO2 98%  Physical Exam Vitals reviewed.  Constitutional:      Comments: Morbidly obese  Pulmonary:     Effort: Pulmonary effort is normal. No respiratory distress.     Breath sounds: Normal breath sounds.  Musculoskeletal:        General: Normal range of motion.     Cervical back: Normal range of motion.  Skin:    General: Skin is warm and dry.  Neurological:     General: No focal deficit present.     Mental Status: She is alert.     ED Results / Procedures / Treatments   Labs (all labs ordered are listed, but only abnormal results are displayed) Labs Reviewed - No data to  display  EKG None  Radiology DG Chest 2 View Result Date: 09/09/2023 CLINICAL DATA:  Cough.  Short of breath. EXAM: CHEST - 2 VIEW COMPARISON:  09/02/2023 and older exams. FINDINGS: Cardiac silhouette is normal in size and configuration. Normal mediastinal and hilar contours. Clear lungs.  No pleural effusion or pneumothorax. Skeletal structures are unremarkable. IMPRESSION: No active cardiopulmonary disease. Electronically Signed   By: Amie Portland M.D.   On: 09/09/2023 08:56    Procedures Procedures    Medications Ordered in ED Medications - No data to display  ED Course/ Medical Decision Making/ A&P                                 Medical Decision Making 43 year old female here today with continued cough following influenza diagnosis 1 week ago.  Differential diagnoses include postviral cough, pneumonia.  Plan-patient has felt like she has not been able to cough anything up, has felt as though she has had congestion in her throat.  Lung sounds clear, no stridor, vital signs normal aside from her baseline hypertension.  My independent review the patient's chest x-ray shows no pneumonia.  Likely continued postviral symptoms.  Considered labs  in this patient, however did not believe that they were indicated given her chief complaint and history.  Patient will follow-up with her PCP.  Amount and/or Complexity of Data Reviewed Radiology: ordered.           Final Clinical Impression(s) / ED Diagnoses Final diagnoses:  Post-viral cough syndrome    Rx / DC Orders ED Discharge Orders          Ordered    benzonatate (TESSALON) 100 MG capsule  Every 8 hours        09/09/23 0907              Anders Simmonds T, DO 09/09/23 [phone]

## 2023-10-17 ENCOUNTER — Ambulatory Visit: Payer: No Typology Code available for payment source | Admitting: Podiatry

## 2023-10-23 ENCOUNTER — Other Ambulatory Visit: Payer: Self-pay

## 2023-10-23 ENCOUNTER — Emergency Department (HOSPITAL_BASED_OUTPATIENT_CLINIC_OR_DEPARTMENT_OTHER)

## 2023-10-23 ENCOUNTER — Emergency Department (HOSPITAL_BASED_OUTPATIENT_CLINIC_OR_DEPARTMENT_OTHER)
Admission: EM | Admit: 2023-10-23 | Discharge: 2023-10-23 | Disposition: A | Discharge status: Home / Self Care | Attending: Emergency Medicine | Admitting: Emergency Medicine

## 2023-10-23 DIAGNOSIS — R55 Syncope and collapse: Secondary | ICD-10-CM | POA: Insufficient documentation

## 2023-10-23 DIAGNOSIS — R42 Dizziness and giddiness: Secondary | ICD-10-CM | POA: Diagnosis present

## 2023-10-23 DIAGNOSIS — R002 Palpitations: Secondary | ICD-10-CM | POA: Diagnosis not present

## 2023-10-23 DIAGNOSIS — H538 Other visual disturbances: Secondary | ICD-10-CM | POA: Diagnosis not present

## 2023-10-23 DIAGNOSIS — R519 Headache, unspecified: Secondary | ICD-10-CM | POA: Insufficient documentation

## 2023-10-23 DIAGNOSIS — I10 Essential (primary) hypertension: Secondary | ICD-10-CM | POA: Insufficient documentation

## 2023-10-23 LAB — BASIC METABOLIC PANEL
Anion gap: 7 (ref 5–15)
BUN: 17 mg/dL (ref 6–20)
CO2: 29 mmol/L (ref 22–32)
Calcium: 8.9 mg/dL (ref 8.9–10.3)
Chloride: 101 mmol/L (ref 98–111)
Creatinine, Ser: 0.97 mg/dL (ref 0.44–1.00)
GFR, Estimated: >60 mL/min (ref >60)
Glucose, Bld: 97 mg/dL (ref 70–99)
Potassium: 3.8 mmol/L (ref 3.5–5.1)
Sodium: 137 mmol/L (ref 135–145)

## 2023-10-23 LAB — URINALYSIS, ROUTINE W REFLEX MICROSCOPIC
Bilirubin Urine: NEGATIVE
Glucose, UA: NEGATIVE mg/dL
Ketones, ur: NEGATIVE mg/dL
Leukocytes,Ua: NEGATIVE
Nitrite: NEGATIVE
Protein, ur: NEGATIVE mg/dL
Specific Gravity, Urine: 1.022 (ref 1.005–1.030)
pH: 7 (ref 5.0–8.0)

## 2023-10-23 LAB — CBC
HCT: 39.7 % (ref 36.0–46.0)
Hemoglobin: 12.3 g/dL (ref 12.0–15.0)
MCH: 25.5 pg — ABNORMAL LOW (ref 26.0–34.0)
MCHC: 31 g/dL (ref 30.0–36.0)
MCV: 82.4 fL (ref 80.0–100.0)
Platelets: 341 10*3/uL (ref 150–400)
RBC: 4.82 MIL/uL (ref 3.87–5.11)
RDW: 15.5 % (ref 11.5–15.5)
WBC: 7.3 10*3/uL (ref 4.0–10.5)
nRBC: 0 % (ref 0.0–0.2)

## 2023-10-23 LAB — PREGNANCY, URINE: Preg Test, Ur: NEGATIVE

## 2023-10-23 MED ORDER — IOHEXOL 350 MG/ML SOLN
100.0000 mL | Freq: Once | INTRAVENOUS | Status: AC | PRN
  Administered 2023-10-23: 75 mL via INTRAVENOUS

## 2023-10-23 NOTE — Discharge Instructions (Addendum)
 As discussed, your labs and imaging are reassuring. I have sent a referral to neurology. They will give you a call in the next several days to schedule an appointment for reevaluation.   Get help right away if: You or a loved one has any signs of a stroke. "BE FAST" is an easy way to remember the warning signs: B - Balance. Dizziness, sudden trouble walking, or loss of balance. E - Eyes. Trouble seeing or a change in how you see. F - Face. Sudden weakness or loss of feeling of the face. The face or eyelid may droop on one side. A - Arms. Weakness or loss of feeling in an arm. This happens all of a sudden and most often on one side of the body. S - Speech. Sudden trouble speaking, slurred speech, or trouble understanding what people say. T - Time. Time to call emergency services. Write down what time symptoms started. You or a loved one has other signs of a stroke, such as: A sudden, very bad headache with no known cause. Feeling like you may vomit (nausea). Vomiting. A seizure.

## 2023-10-23 NOTE — ED Triage Notes (Addendum)
 This AM-dizzy, vision changes, heart racing- took OTC meds to help migraine. Many rotating symptoms on and off for a week. Transient loss of vision- bilateral-1230. NIH 0 in triage.

## 2023-10-24 LAB — CBG MONITORING, ED: Glucose-Capillary: 78 mg/dL (ref 70–99)

## 2023-10-24 NOTE — ED Provider Notes (Signed)
 [place] [facility]MENT AT [facility] Provider Note   CSN: [id number] Arrival date & time: 10/23/23  1623     History  Chief Complaint  Patient presents with   Headache    Chloe Castillo is a 43 y.o. female history of migraines, hypertension, and morbid obesity who presents the ED today for multiple concerns.  Patient reports migraine headaches morning with associated dizziness, palpitations, and darkening of vision of the left eye.  Patient states that this has been happening on and off for the past week.  Denies any slurred speech, weakness, or confusion.  She was seen at urgent care and sent here for further evaluation for possible stroke.  Denies any symptoms at the time of evaluation in the ED.  No additional complaints or concerns at this time.    Home Medications Prior to Admission medications   Medication Sig Start Date End Date Taking? Authorizing Provider  acetaminophen (TYLENOL) 500 MG tablet Take 1,000 mg by mouth every 6 (six) hours as needed (headache).    [provider]  benzonatate (TESSALON) 100 MG capsule Take 1 capsule (100 mg total) by mouth every 8 (eight) hours. 09/09/23   Anders Simmonds T, DO  diclofenac Sodium (VOLTAREN) 1 % GEL Apply 2 g topically 4 (four) times daily. 05/22/22   Jeannie Fend, PA-C  methocarbamol (ROBAXIN) 500 MG tablet Take 1 tablet (500 mg total) by mouth 2 (two) times daily. 05/22/22   Jeannie Fend, PA-C      Allergies    Patient has no known allergies.    Review of Systems   Review of Systems  Neurological:  Positive for headaches.  All other systems reviewed and are negative.   Physical Exam Updated Vital Signs BP (!) 153/87   Pulse 70   Temp 97.9 F (36.6 C) (Oral)   Resp 18   SpO2 99%  Physical Exam Vitals and nursing note reviewed.  Constitutional:      General: She is not in acute distress.    Appearance: Normal appearance.  HENT:     Head: Normocephalic and atraumatic.     Mouth/Throat:      Mouth: Mucous membranes are moist.  Eyes:     Conjunctiva/sclera: Conjunctivae normal.     Pupils: Pupils are equal, round, and reactive to light.  Cardiovascular:     Rate and Rhythm: Normal rate and regular rhythm.     Pulses: Normal pulses.     Heart sounds: Normal heart sounds.  Pulmonary:     Effort: Pulmonary effort is normal.     Breath sounds: Normal breath sounds.  Abdominal:     Palpations: Abdomen is soft.     Tenderness: There is no abdominal tenderness.  Musculoskeletal:        General: Normal range of motion.     Cervical back: Normal range of motion.  Skin:    General: Skin is warm and dry.     Findings: No rash.  Neurological:     General: No focal deficit present.     Mental Status: She is alert.     Cranial Nerves: No cranial nerve deficit.     Sensory: No sensory deficit.     Motor: No weakness.     Coordination: Coordination normal.  Psychiatric:        Mood and Affect: Mood normal.        Behavior: Behavior normal.    ED Results / Procedures / Treatments   Labs (all labs  ordered are listed, but only abnormal results are displayed) Labs Reviewed  CBC - Abnormal; Notable for the following components:      Result Value   MCH 25.5 (*)    All other components within normal limits  URINALYSIS, ROUTINE W REFLEX MICROSCOPIC - Abnormal; Notable for the following components:   Hgb urine dipstick LARGE (*)    Bacteria, UA RARE (*)    All other components within normal limits  BASIC METABOLIC PANEL  PREGNANCY, URINE  CBG MONITORING, ED    EKG EKG Interpretation Date/Time:  Tuesday October 23 2023 16:46:40 EST Ventricular Rate:  58 PR Interval:  172 QRS Duration:  74 QT Interval:  410 QTC Calculation: 402 R Axis:   68  Text Interpretation: Sinus bradycardia Cannot rule out Anterior infarct , age undetermined Abnormal ECG When compared with ECG of 05-Dec-2019 20:34, PREVIOUS ECG IS PRESENT Confirmed by Ernie Avena ([id number]) on 10/23/2023 9:29:57  PM  Radiology CT VENOGRAM HEAD Result Date: 10/23/2023 CLINICAL DATA:  Vision loss, binocular EXAM: CT VENOGRAM HEAD TECHNIQUE: Venographic phase images of the brain were obtained following the administration of intravenous contrast. Multiplanar reformats and maximum intensity projections were generated. RADIATION DOSE REDUCTION: This exam was performed according to the departmental dose-optimization program which includes automated exposure control, adjustment of the mA and/or kV according to patient size and/or use of iterative reconstruction technique. CONTRAST:  75mL OMNIPAQUE IOHEXOL 350 MG/ML SOLN COMPARISON:  CT head April 16, 21. FINDINGS: Remote lacunar infarct in the left basal ganglia. No evidence of acute large vascular territory infarct, mass lesion, midline shift or hydrocephalus. No definite evidence of dural venous sinus thrombosis; however, evaluation is limited due to poor contrast timing/dural venous sinus opacification. 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 Right sigmoid and transverse sinus are also poorly evaluated but appear patent. The superior sagittal sinus, visualized straight sinus and deep cerebral veins are patent. IMPRESSION: 1. Limited CTV without convincing evidence of acute intracranial abnormality or dural venous sinus thrombosis. An MRI head with and without contrast may provide better evaluation if clinically warranted. 2. Small left transverse and sigmoid sinus and partially empty sella, which can be associated with idiopathic intracranial hypertension in the correct clinical setting. Electronically Signed   By: Feliberto Harts M.D.   On: 10/23/2023 20:04    Procedures Procedures: not indicated.   Medications Ordered in ED Medications  iohexol (OMNIPAQUE) 350 MG/ML injection 100 mL (75 mLs Intravenous Contrast Given 10/23/23 1811)    ED Course/ Medical Decision Making/ A&P                                  Medical Decision Making Amount and/or Complexity of Data Reviewed Labs: ordered. Radiology: ordered.  Risk Prescription drug management.   This patient presents to the ED for concern of vision changes, this involves an extensive number of treatment options, and is a complaint that carries with it a high risk of complications and morbidity.   Differential diagnosis includes: stroke, venous thrombosis, brain tumor, optic neuritis, acute glaucoma, migraine, syncope, electrolyte derangement, etc.   Comorbidities  See HPI above   Additional History  Additional history obtained from records   Cardiac Monitoring / EKG  The patient was maintained on a cardiac monitor.  I personally viewed and interpreted the cardiac monitored which showed: sinus bradycardia with a heart rate of 58 bpm.   Lab Tests  I ordered and personally interpreted labs.  The pertinent results include:   BMP and CBC unremarkable Negative prior to test UA shows rare bacteria but no leukocytes or nitrates   Imaging Studies  I ordered imaging studies including CT venogram head  I independently visualized and interpreted imaging which showed:  Limited CT PE without convincing evidence of acute intracranial abnormality or dural venous sinus thrombosis. Small left transverse and sigmoid sinus and partially empty sella, can be associated with idiopathic intracranial hypertension. I agree with the radiologist interpretation   Problem List / ED Course / Critical Interventions / Medication Management  Patient reports migraines with palpitations, lightheadedness, and left eye vision changes intermittently for the past week.  She was evaluated urgent care earlier today and told to come here for further evaluation to rule out stroke. Valuation she has no neurologic focal deficits or headaches. Patient staffed with my attending, Dr. Karene Fry With shared decision making, patient deferred transfer to either [facility]  or [facility] for MRI of the head and would rather go home.  Referral for neurology provided for close follow-up for reevaluation.  Return precautions given.  Social Determinants of Health  Access to healthcare   Test / Admission - Considered  Patient is stable and safe for discharge home. Strict return precautions provided.       Final Clinical Impression(s) / ED Diagnoses Final diagnoses:  Near syncope    Rx / DC Orders ED Discharge Orders          Ordered    Ambulatory referral to Neurology       Comments: An appointment is requested in approximately: 1 week   10/23/23 2136              Maxwell Marion, PA-C 10/24/23 0050    Ernie Avena, MD 10/27/23 [phone]

## 2023-10-30 ENCOUNTER — Encounter: Payer: Self-pay | Admitting: Neurology

## 2023-10-30 ENCOUNTER — Ambulatory Visit: Admitting: Neurology

## 2023-10-30 VITALS — BP 120/80 | HR 65 | Ht 63.0 in | Wt 354.0 lb

## 2023-10-30 DIAGNOSIS — R42 Dizziness and giddiness: Secondary | ICD-10-CM

## 2023-10-30 DIAGNOSIS — H538 Other visual disturbances: Secondary | ICD-10-CM

## 2023-10-30 DIAGNOSIS — R0681 Apnea, not elsewhere classified: Secondary | ICD-10-CM

## 2023-10-30 DIAGNOSIS — R0683 Snoring: Secondary | ICD-10-CM

## 2023-10-30 DIAGNOSIS — R519 Headache, unspecified: Secondary | ICD-10-CM

## 2023-10-30 DIAGNOSIS — G4719 Other hypersomnia: Secondary | ICD-10-CM

## 2023-10-30 DIAGNOSIS — Z9189 Other specified personal risk factors, not elsewhere classified: Secondary | ICD-10-CM

## 2023-10-30 DIAGNOSIS — R351 Nocturia: Secondary | ICD-10-CM

## 2023-10-30 DIAGNOSIS — Z6841 Body Mass Index (BMI) 40.0 and over, adult: Secondary | ICD-10-CM

## 2023-10-30 DIAGNOSIS — R55 Syncope and collapse: Secondary | ICD-10-CM

## 2023-10-30 NOTE — Progress Notes (Signed)
 Subjective:    Patient ID: Chloe Castillo is a 43 y.o. female.  HPI    Huston Foley, MD, PhD [facility] [address] [address] [place], [place] [address]    I saw patient, Chloe Castillo, as a referral from the emergency room for initial consultation of her near syncope.  The patient is unaccompanied today.  [name] is a 43 year old female with an underlying medical history of hypertension, fibroids, morbid obesity, who reports intermittent feeling of lightheadedness for the past 3 weeks.  Reports that she often has a headache with it.  She has not been treated for her headaches by her PCP yet.  She has establish with a new family medicine primary care but does not know the name of her provider.  She presented to the emergency room on 10/23/2023 with dizziness as well as headache and blurry vision.  I reviewed the emergency room records.  Her blood pressure was elevated at 153/87 at the time.  She had a CT venogram of the head with contrast on 10/23/2023 and I reviewed the results:  IMPRESSION: 1. Limited CTV without convincing evidence of acute intracranial abnormality or dural venous sinus thrombosis. An MRI head with and without contrast may provide better evaluation if clinically warranted. 2. Small left transverse and sigmoid sinus and partially empty sella, which can be associated with idiopathic intracranial hypertension in the correct clinical setting.  She had a head CT without contrast on 12/05/2019 and I reviewed the results:  IMPRESSION: 1.  No acute intracranial pathology.   2. 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 MRI may be helpful to further evaluate.   She reports snoring and witnessed apneas.  She has woken up with a headache.  She has nocturia about once per average night.  Her Epworth sleepiness score is 15 out of 24, fatigue severity score is 9 out of  63.  She lives with 3 of her 6 children.  She works full-time as an International aid/development worker at [employer].  She is a non-smoker and does not drink alcohol.  She drinks caffeine in the form of sweet tea, about 2 to 3 cups/day, occasional soda, very occasional iced tea.  She drinks about 3 bottles of water per day.  She denies any sudden onset one-sided weakness or numbness or tingling or droopy face or slurring of speech but has had intermittent blurry vision.  She had contact lenses in the past but they were irritating her eyes so she stopped using them.  She did not go back for eyeglasses. She feels that her left eye is worse compared to her right.  She has not had any loss of vision.  She has not passed out or fallen to the ground.  She states that 2 of her daughters have migraines.  She is not aware of any family history of sleep apnea.  I had evaluated her for syncope in the past.  I had ordered a brain MRI but she did not have it done.  I had also ordered a sleep study and EEG, she did not have testing done.  She was advised to follow-up with PCP and discuss referral to a cardiologist.  She was lost to follow-up.  Previously:  12/10/2019: 43 year old right-handed woman with an underlying medical history of hypertension, and morbid obesity with a BMI of over 50, who presented to the emergency room after an unwitnessed collapse  at work. She had felt flushed and lightheaded and lost consciousness in the bathroom at work; was found by a Cabin crew, EMS was called. No convulsions, no B/B incontinence.     I reviewed the emergency room records from 12/05/2019.  She had a head CT without contrast On 12/05/2019 and I reviewed the results:  IMPRESSION: 1.  No acute intracranial pathology.   2. 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 MRI may be helpful to further evaluate. She was treated symptomatically with fluids.  Laboratory work-up  showed no obvious abnormality with the exception of slightly elevated creatinine at 1.21.  Given the CT abnormality she was advised to proceed with a brain MRI with and without contrast but she declined and opted to get discharged from the emergency room.   She denies any recent one-sided weakness or numbness or tingling or droopy face or slurring of speech.  She has some blurry vision and sometimes she has light sensitivity.  She reports that she has prescription eyeglasses but lost them and has not seen an eye doctor in 12 to 13 years.  She reports that her primary care physician wanted her to have a sleep study.  She does not sleep very well or very much, averages about 4 to 5 hours of sleep on any given night, works long hours as an International aid/development worker at [employer].  She works 10 to 12-hour shifts.  Typically she is at work around 4.  She lives with her children, she is single, her boyfriend lives on [place].  She has 6 children, ages 72, 71, 30, 17, 51, and 5.  Her 43 year old is in the [employer] and away, otherwise she lives with her 5 children.  She has no night to night nocturia but has woken up with a headache.  She does not drink any sodas, she tries to hydrate well with water.  She has not had any vertiginous symptoms.       Her Past Medical History Is Significant For: Past Medical History:  Diagnosis Date   Fibroids    Hypertension    Morbidly obese (HCC)     Her Past Surgical History Is Significant For: Past Surgical History:  Procedure Laterality Date   NO PAST SURGERIES      Her Family History Is Significant For: Family History  Problem Relation Age of Onset   Hypertension Mother    Hypertension Father    Migraines Daughter    Migraines Daughter    Heart Problems Other     Her Social History Is Significant For: Social History   Socioeconomic History   Marital status: Married    Spouse name: Not on file   Number of children: 6   Years of education: Not on file    Highest education level: Not on file  Occupational History   Not on file  Tobacco Use   Smoking status: Never   Smokeless tobacco: Never  Vaping Use   Vaping status: Never Used  Substance and Sexual Activity   Alcohol use: No   Drug use: No   Sexual activity: Never    Birth control/protection: None  Other Topics Concern   Not on file  Social History Narrative   Lives at home with her children. Her husband is in the [place]   Right handed   Caffeine: 2 red bull  at most in a day, on average has 1  per day, occasionally will have a soda   Social Drivers of Corporate investment banker Strain: Not on file  Food Insecurity: Low Risk  (10/24/2023)   Received from [facility]   Hunger Vital Sign    Worried About Running Out of Food in the Last Year: Never true    Ran Out of Food in the Last Year: Never true  Transportation Needs: No Transportation Needs (10/24/2023)   Received from [facility]    In the past 12 months, has lack of reliable transportation kept you from medical appointments, meetings, work or from getting things needed for daily living? : No  Physical Activity: Not on file  Stress: Not on file  Social Connections: Not on file    Her Allergies Are:  Allergies  Allergen Reactions   Pollen Extract   :   Her Current Medications Are:  Outpatient Encounter Medications as of 10/30/2023  Medication Sig   acetaminophen (TYLENOL) 500 MG tablet Take 1,000 mg by mouth every 6 (six) hours as needed (headache).   lisinopril (ZESTRIL) 20 MG tablet Take 1 tablet by mouth daily.   diclofenac Sodium (VOLTAREN) 1 % GEL Apply 2 g topically 4 (four) times daily.   [DISCONTINUED] benzonatate (TESSALON) 100 MG capsule Take 1 capsule (100 mg total) by mouth every 8 (eight) hours. (Patient not taking: Reported on 10/30/2023)   [DISCONTINUED] methocarbamol (ROBAXIN) 500 MG tablet Take 1 tablet (500 mg total) by mouth 2 (two) times daily. (Patient not taking:  Reported on 10/30/2023)   No facility-administered encounter medications on file as of 10/30/2023.  :   Review of Systems:  Out of a complete 14 point review of systems, all are reviewed and negative with the exception of these symptoms as listed below:  Review of Systems  Neurological:        Patient is here alone for referral for migraines and near syncope. She states the two are associated with each other. She states she will be getting her eyes checked out. When a migraine is coming on, before it gets strong, everything feels dark and she feels like she needs light. Once it "sets in" and the migraine pain gets worse then she doesn't want any light. She just wants to sleep. She states she has never passed out nor has her hearing gone out. She gets a migraine weekly. They can last 1 hour or so. She has taken Paracetamol and it helps. Both of her daughters also get migraines. ESS 15 FSS 9    Objective:  Neurological Exam  Physical Exam Physical Examination:   Vitals:   10/30/23 0858  BP: 120/80  Pulse: 65    General Examination: The patient is a very pleasant 43 y.o. female in no acute distress. She appears well-developed and well-nourished and well groomed.   HEENT: Normocephalic, atraumatic, pupils are equal, round and reactive to light and accommodation. Funduscopic is difficult due to very small pupils.  She is somewhat light sensitive.  Disc margins not fully seen on either side.  Extraocular tracking is good without limitation to gaze excursion or nystagmus noted. Normal smooth pursuit is noted. Hearing is grossly intact. Face is symmetric with normal facial animation. Speech is clear with no dysarthria noted. There is no hypophonia. There is no lip, neck/head, jaw or voice tremor. Neck is supple with full range of passive and active motion. There are no carotid bruits on auscultation. Oropharynx exam reveals: mild mouth dryness, adequate dental hygiene and  marked airway crowding,  due to thicker soft palate, smaller airway, larger uvula, tonsils of about 2+. Mallampati is class III. Tongue protrudes centrally and palate elevates symmetrically. Neck size is 21.25 inches.    Chest: Clear to auscultation without wheezing, rhonchi or crackles noted.   Heart: S1+S2+0, regular and normal without murmurs, rubs or gallops noted.    Abdomen: Soft, non-tender and non-distended with normal bowel sounds appreciated on auscultation.   Extremities: There is no pitting edema in the distal lower extremities bilaterally.   Skin: Warm and dry without trophic changes noted.   Musculoskeletal: exam reveals no obvious joint deformities, tenderness or joint swelling or erythema.    Neurologically:  Mental status: The patient is awake, alert and oriented in all 4 spheres. Her immediate and remote memory, attention, language skills and fund of knowledge are appropriate. There is no evidence of aphasia, agnosia, apraxia or anomia. Speech is clear with normal prosody and enunciation. Thought process is linear. Mood is normal and affect is normal.  Cranial nerves II - XII are as described above under HEENT exam. In addition: shoulder shrug is normal with equal shoulder height noted. Motor exam: Normal bulk, strength and tone is noted. There is no drift, tremor or rebound. Romberg is negative. Reflexes are 1+ in the UEs, and trace in the LEs. Babinski: Toes are flexor bilaterally. Fine motor skills and coordination: intact with normal finger taps, normal hand movements, normal rapid alternating patting, normal foot taps and normal foot agility.  Cerebellar testing: No dysmetria or intention tremor on finger to nose testing. Heel to shin is difficult and quite limited due to body habitus, no truncal or gait ataxia.  Sensory exam: intact to light touch in the upper and lower extremities.  Gait, station and balance: She stands easily. No veering to one side is noted. No leaning to one side is noted.  Posture is age-appropriate and stance is narrow based. Gait shows normal stride length and normal pace. No problems turning are noted. Tandem walk is unremarkable.    Assessment and Plan:    In summary, Najla Aughenbaugh is a 43 year old female with an underlying medical history of hypertension, and morbid obesity with a BMI of over 60, who presents as a referral from the emergency room for evaluation of presyncopal spells.  She reports intermittent headaches as well and blurry vision.  CT venogram showed a partially empty sella.  She has previously had prescription contact lenses but is not currently wearing them.  She reports light sensitivity and intermittent blurry vision especially on the left side.  I had an extended visit with the patient today, this was a visit over 1 hour with extended chart review and addressing numerous problems and considerable counseling and coordination of care was involved as well.  Below is a summary of my recommendations and discussion points with her based on today's visit.  She was given these instructions in writing in her MyChart after visit summary and also verbally during this visit.  She can access her AVS electronically, as she endorsed.  Of note, she reports that she was told to follow-up with neurology.  She was not advised of any results of her CT venogram she indicates.  << Please remember, common headache triggers are: sleep deprivation, dehydration, overheating, stress, hypoglycemia or skipping meals and blood sugar fluctuations, excessive pain medications or excessive alcohol use or caffeine withdrawal. Some people have food triggers such as aged cheese, orange juice or chocolate, especially dark chocolate, or  MSG (monosodium glutamate). Try to avoid these headache triggers as much possible. It may be helpful to keep a headache diary to figure out what makes your headaches worse or brings them on and what alleviates them. Some people report headache onset after  exercise but studies have shown that regular exercise may actually prevent headaches from coming. If you have exercise-induced headaches, please make sure that you drink plenty of fluid before and after exercising and that you do not over do it and do not overheat. Please reduce limit caffeine to 1-2 servings/day, as caffeine can drive headaches.  We will do a brain scan, called MRI and call you with the test results. We will have to schedule you for this on a separate date. This test requires authorization from your insurance, and we will take care of the insurance process. I will order a sleep study to look for signs of obstructive sleep apnea (aka OSA). As explained, the long-term risks and ramifications of untreated moderate to severe obstructive sleep apnea may include (but are not limited to): increased risk for cardiovascular disease, including congestive heart failure, stroke, difficult to control hypertension, treatment resistant obesity, arrhythmias, especially irregular heartbeat commonly known as A. Fib. (atrial fibrillation); even type 2 diabetes has been linked to untreated OSA.  Please make an appointment with any optometrist or ophthalmologist of your choosing, please have a full, dilated eye exam. You were found to have a so-called "partially empty sella" on your recent head CT scan. This is typically an incidental finding but can be associated with a condition called idiopathic intracranial hypertension (IIH) aka a condition called "pseudotumor cerebri".  This means that there may be an increased fluid pressure around the brain, resulting in pressure on your brain, which can cause headaches, and pressure on the eye nerve(s), causing blurry vision, visual distortion and even loss of vision in extreme cases. That is why I recommend a full, dilated, eye exam including visual field testing, to see if there is any loss of peripheral vision. We can make a referral to ophthalmology if needed,  otherwise you can see any optometrist or ophthalmologist of your choosing.  Please asked them to send records of their office visit to [place].  Depending on the eye evaluation, we may consider a LP (lumbar puncture/spinal tap) with pressure testing on your spinal fluid and routine fluid testing. Eventually, if the spinal fluid pressure is indeed elevated, we will consider starting you on a medication called diamox to help keep the spinal fluid pressure at bay.  Some people need more than 1 spinal tap over time. Please follow-up with PCP to discuss a blood pressure log. Also, losing weight can improve the risk for intracranial hypertension and sleep apnea. We will plan a follow up in about 3 months. >> I answered all her questions today and she was in agreement with the plan.   Huston Foley, MD, PhD

## 2023-10-30 NOTE — Patient Instructions (Signed)
 As discussed, your headaches are likely due to a combination of factors, including migraines, BP fluctuation, strain on the eyes and possible sleep apnea.   Here is what we discussed today and my recommendations for you:   Please remember, common headache triggers are: sleep deprivation, dehydration, overheating, stress, hypoglycemia or skipping meals and blood sugar fluctuations, excessive pain medications or excessive alcohol use or caffeine withdrawal. Some people have food triggers such as aged cheese, orange juice or chocolate, especially dark chocolate, or MSG (monosodium glutamate). Try to avoid these headache triggers as much possible. It may be helpful to keep a headache diary to figure out what makes your headaches worse or brings them on and what alleviates them. Some people report headache onset after exercise but studies have shown that regular exercise may actually prevent headaches from coming. If you have exercise-induced headaches, please make sure that you drink plenty of fluid before and after exercising and that you do not over do it and do not overheat. Please reduce limit caffeine to 1-2 servings/day, as caffeine can drive headaches.  We will do a brain scan, called MRI and call you with the test results. We will have to schedule you for this on a separate date. This test requires authorization from your insurance, and we will take care of the insurance process. I will order a sleep study to look for signs of obstructive sleep apnea (aka OSA). As explained, the long-term risks and ramifications of untreated moderate to severe obstructive sleep apnea may include (but are not limited to): increased risk for cardiovascular disease, including congestive heart failure, stroke, difficult to control hypertension, treatment resistant obesity, arrhythmias, especially irregular heartbeat commonly known as A. Fib. (atrial fibrillation); even type 2 diabetes has been linked to untreated OSA.   Please make an appointment with any optometrist or ophthalmologist of your choosing, please have a full, dilated eye exam. You were found to have a so-called "partially empty sella" on your recent head CT scan. This is typically an incidental finding but can be associated with a condition called idiopathic intracranial hypertension (IIH) aka a condition called "pseudotumor cerebri".  This means that there may be an increased fluid pressure around the brain, resulting in pressure on your brain, which can cause headaches, and pressure on the eye nerve(s), causing blurry vision, visual distortion and even loss of vision in extreme cases. That is why I recommend a full, dilated, eye exam including visual field testing, to see if there is any loss of peripheral vision. We can make a referral to ophthalmology if needed, otherwise you can see any optometrist or ophthalmologist of your choosing.  Please asked them to send records of their office visit to [place].  Depending on the eye evaluation, we may consider a LP (lumbar puncture/spinal tap) with pressure testing on your spinal fluid and routine fluid testing. Eventually, if the spinal fluid pressure is indeed elevated, we will consider starting you on a medication called diamox to help keep the spinal fluid pressure at bay.  Some people need more than 1 spinal tap over time. Please follow-up with PCP to discuss a blood pressure log. Also, losing weight can improve the risk for intracranial hypertension and sleep apnea. We will plan a follow up in about 3 months.

## 2023-11-05 ENCOUNTER — Telehealth: Payer: Self-pay | Admitting: Neurology

## 2023-11-05 NOTE — Telephone Encounter (Signed)
 [employer] a case was started on 10/31/23.  I just checked the status of the case it is still pending.

## 2023-11-06 ENCOUNTER — Other Ambulatory Visit (HOSPITAL_COMMUNITY): Payer: Self-pay

## 2023-11-06 MED ORDER — WEGOVY 0.5 MG/0.5ML ~~LOC~~ SOAJ
0.5000 mg | SUBCUTANEOUS | 1 refills | Status: AC
  Filled 2023-11-06 – 2023-11-15 (×3): qty 2, 28d supply, fill #0

## 2023-11-06 NOTE — Telephone Encounter (Signed)
 [employer] MCD [facility] Berkley Harvey: [id number] (exp. 10/31/23 to 12/19/23)

## 2023-11-07 NOTE — Telephone Encounter (Signed)
 I called the patient but her phone would not let me leave a vmail.

## 2023-11-08 ENCOUNTER — Other Ambulatory Visit (HOSPITAL_COMMUNITY): Payer: Self-pay

## 2023-11-09 ENCOUNTER — Telehealth: Payer: Self-pay | Admitting: Neurology

## 2023-11-09 NOTE — Telephone Encounter (Signed)
 [employer] Berkley Harvey: [id number] exp. 11/09/23-12/24/23 sent to [facility] [phone]

## 2023-11-15 ENCOUNTER — Other Ambulatory Visit (HOSPITAL_COMMUNITY): Payer: Self-pay

## 2023-11-15 MED ORDER — WEGOVY 0.25 MG/0.5ML ~~LOC~~ SOAJ
0.2500 mg | SUBCUTANEOUS | 0 refills | Status: AC
  Filled 2023-11-15: qty 2, 28d supply, fill #0

## 2023-11-15 NOTE — Telephone Encounter (Signed)
 I attempted again to contact the patient to schedule her SS, but her number would not let me leave a voicemail.

## 2023-12-06 ENCOUNTER — Ambulatory Visit (INDEPENDENT_AMBULATORY_CARE_PROVIDER_SITE_OTHER): Admitting: Podiatry

## 2023-12-06 ENCOUNTER — Encounter: Payer: Self-pay | Admitting: Podiatry

## 2023-12-06 ENCOUNTER — Ambulatory Visit (INDEPENDENT_AMBULATORY_CARE_PROVIDER_SITE_OTHER)

## 2023-12-06 DIAGNOSIS — M7662 Achilles tendinitis, left leg: Secondary | ICD-10-CM

## 2023-12-06 DIAGNOSIS — M7752 Other enthesopathy of left foot: Secondary | ICD-10-CM

## 2023-12-06 MED ORDER — METHYLPREDNISOLONE 4 MG PO TBPK
ORAL_TABLET | ORAL | 0 refills | Status: AC
Start: 2023-12-06 — End: ?

## 2023-12-06 NOTE — Patient Instructions (Signed)

## 2023-12-10 NOTE — Progress Notes (Signed)
 Subjective:  Patient ID: Chloe Castillo, female    DOB: [birth year],  MRN: [id number]  Chief Complaint  Patient presents with   Foot Pain    RM#12 Left heel pain more towards the back of heel has had injection in the past that she had relief from.Pain started 1 week ago on her feet a lot during the day.    Discussed the use of AI scribe software for clinical note transcription with the patient, who gave verbal consent to proceed.  History of Present Illness The patient, with a history of a work-related foot injury, presents with chronic left heel pain. The pain, which has been present for several years, is intermittent and worsens with cold weather. The patient reports a previous slip and fall injury at work in 2016-2017, but did not seek treatment at the time as she did not notice any immediate symptoms. Recently, she received an injection for the pain, which provided temporary relief, but the pain has since returned. The patient also notes that she tends to put more weight on the left foot, which may be contributing to the pain.      Objective:    Physical Exam General: AAO x3, NAD  Dermatological: Skin is warm, dry and supple bilateral.  There are no open sores, no preulcerative lesions, no rash or signs of infection present.  Vascular: Dorsalis Pedis artery and Posterior Tibial artery pedal pulses are 2/4 bilateral with immedate capillary fill time.  There is no pain with calf compression, swelling, warmth, erythema.   Neruologic: Grossly intact via light touch bilateral.  Negative Tinel sign.  Musculoskeletal: Tenderness localized to the posterior aspect the calcaneus on the distal portion of the Achilles tendon and along the attachment.  Clinically the tendon appears to be intact.  Equinus is present.  There is no deficit noted on the Achilles tendon.  There is no significant pain on plantar fascia and plantar aspect of heel.  There is no pain with lateral compression of calcaneus.   Decreased medial arch height.    No images are attached to the encounter.    Results RADIOLOGY Heel X-ray: Calcaneal spur on the posterior aspect of the heel.  No evidence of acute fracture.  Decreased calcaneal inclination angle.  (11/05/2023)   Assessment:   1. Tendonitis, Achilles, left      Plan:  Patient was evaluated and treated and all questions answered.  Assessment and Plan Assessment & Plan Heel spur with associated Achilles tendinitis Chronic left heel pain due to Achilles tendinitis and heel spur. Pain exacerbated by weight-bearing and cold. Conservative management preferred over surgery due to risks. - Prescribe oral steroids to reduce inflammation.  Would recommend holding off another injection today she just had an injection 1 month ago.  Discussed risks of injection of this area. - Advise daily icing of the affected area. - Provide exercises to stretch calf muscle and Achilles tendon. - Recommend night splint for dorsiflexion during sleep. Static or dynamic ankle foot orthosis, including soft interface material, adjustable for fit, for positioning, may be used for minimal ambulation, prefabricated, off-the-shelf was dispensed on the billed date of service  - Provide gel pad for heel spur cushioning. - Advise shoes with arch support and heel lifts. - Emphasize consistent stretching and rehabilitation exercises. - Encourage weight loss to reduce foot pressure. - Instruct to contact clinic if symptoms worsen or do not improve.   Return in about 2 months (around 02/05/2024) for heel spur, heel pain.   Zoila Hines  Deitra Fava DPM

## 2023-12-28 ENCOUNTER — Other Ambulatory Visit (HOSPITAL_COMMUNITY): Payer: Self-pay

## 2023-12-28 MED ORDER — WEGOVY 0.5 MG/0.5ML ~~LOC~~ SOAJ
0.5000 mg | SUBCUTANEOUS | 1 refills | Status: AC
  Filled 2023-12-28: qty 2, 28d supply, fill #0

## 2024-01-03 ENCOUNTER — Other Ambulatory Visit (HOSPITAL_COMMUNITY): Payer: Self-pay

## 2024-01-03 MED ORDER — WEGOVY 0.25 MG/0.5ML ~~LOC~~ SOAJ
0.2500 mg | SUBCUTANEOUS | 0 refills | Status: AC
  Filled 2024-01-03 – 2024-01-28 (×2): qty 2, 28d supply, fill #0

## 2024-01-29 ENCOUNTER — Other Ambulatory Visit (HOSPITAL_COMMUNITY): Payer: Self-pay

## 2024-01-30 ENCOUNTER — Other Ambulatory Visit (HOSPITAL_COMMUNITY): Payer: Self-pay | Admitting: Nurse Practitioner

## 2024-01-30 DIAGNOSIS — Z1231 Encounter for screening mammogram for malignant neoplasm of breast: Secondary | ICD-10-CM

## 2024-01-31 ENCOUNTER — Ambulatory Visit (HOSPITAL_COMMUNITY)
Admission: RE | Admit: 2024-01-31 | Discharge: 2024-01-31 | Disposition: A | Discharge status: Home / Self Care | Source: Ambulatory Visit

## 2024-01-31 ENCOUNTER — Encounter (HOSPITAL_COMMUNITY): Payer: Self-pay

## 2024-01-31 DIAGNOSIS — Z1231 Encounter for screening mammogram for malignant neoplasm of breast: Secondary | ICD-10-CM | POA: Diagnosis present

## 2024-02-05 ENCOUNTER — Ambulatory Visit (INDEPENDENT_AMBULATORY_CARE_PROVIDER_SITE_OTHER): Admitting: Podiatry

## 2024-02-05 ENCOUNTER — Encounter (HOSPITAL_COMMUNITY): Payer: Self-pay | Admitting: Nurse Practitioner

## 2024-02-05 DIAGNOSIS — M7662 Achilles tendinitis, left leg: Secondary | ICD-10-CM | POA: Diagnosis not present

## 2024-02-05 MED ORDER — CELECOXIB 100 MG PO CAPS: 100.0000 mg | ORAL_CAPSULE | Freq: Two times a day (BID) | ORAL | 1 refills | Status: AC

## 2024-02-07 ENCOUNTER — Other Ambulatory Visit (HOSPITAL_COMMUNITY): Payer: Self-pay | Admitting: Nurse Practitioner

## 2024-02-07 ENCOUNTER — Ambulatory Visit: Admitting: Neurology

## 2024-02-07 ENCOUNTER — Telehealth: Payer: Self-pay | Admitting: *Deleted

## 2024-02-07 DIAGNOSIS — R928 Other abnormal and inconclusive findings on diagnostic imaging of breast: Secondary | ICD-10-CM

## 2024-02-07 NOTE — Addendum Note (Signed)
 Addended by: Rilley Poulter on: 02/07/2024 03:34 PM   Modules accepted: Orders

## 2024-02-07 NOTE — Telephone Encounter (Signed)
 Called [facility] and updated location on auth, the auth # and dates remain the same.  Sent order to [facility] for scheduling.

## 2024-02-07 NOTE — Telephone Encounter (Signed)
 Pt was scheduled for a 3 month follow-up today. Per Dr Omar Bibber, I called the patient to inquire regarding her MRI and NPSG not being done yet. Pt said she had not gotten any calls to schedule. I see in chart where an attempt was made to call patient for MRI back in March. Sleep study order was also closed out due to being unable to contact patient. Patient said she did see her eye doctor. She will contact them and have the notes faxed to us . She will also be on the lookout for calls to schedule MRI and SS. She is aware the authorizations will likely have to be renewed at this point. Pt ok with postponing appt today to give time for testing to be completed. Pt was r/ to next opening at 9/4 at 7:45 am. She is welcome to call for sooner appt if all testing gets completed much sooner. She thanked me for the call.

## 2024-02-07 NOTE — Telephone Encounter (Signed)
 Spoke with patient. She spoke with GI today regarding her MRI and stated she is severely claustrophobic. She is asking if she can be referred for an MRI with sedation.

## 2024-02-07 NOTE — Telephone Encounter (Signed)
 MRI auth#: [id number] exp. 02/07/24-03/23/24 sent to GI [phone].

## 2024-02-07 NOTE — Telephone Encounter (Signed)
 New MRI brain order placed with sedation, to be done at [facility].

## 2024-02-10 NOTE — Progress Notes (Signed)
  Subjective:  Patient ID: Chloe Castillo, female    DOB: 1981-07-31,  MRN: [id number]  Chief Complaint  Patient presents with   Foot Pain    RM#13 Patient presents today with left heel pain states nothing is helping relieve the pain night splint not helping.     History of Present Illness The patient, with a history of a work-related foot injury, presents for follow-up evaluation of chronic left heel pain. The pain, which has been present for several years, is intermittent and worsens with cold weather. The patient reports a previous slip and fall injury at work in 2016-2017, but did not seek treatment at the time as she did not notice any immediate symptoms. Recently, she received an injection for the pain, which provided temporary relief, but the pain has since returned. The patient also notes that she tends to put more weight on the left foot, which may be contributing to the pain.  Today she presents for follow-up.  She states that she still having pain.  Not sure if the steroids were helpful.      Objective:    Physical Exam General: AAO x3, NAD  Dermatological: Skin is warm, dry and supple bilateral.  There are no open sores, no preulcerative lesions, no rash or signs of infection present.  Vascular: Dorsalis Pedis artery and Posterior Tibial artery pedal pulses are 2/4 bilateral with immedate capillary fill time.  There is no pain with calf compression, swelling, warmth, erythema.   Neruologic: Grossly intact via light touch bilateral.  Negative Tinel sign.  Musculoskeletal: There is still localized tenderness along the posterior aspect the calcaneus on the distal portion of the Achilles tendon and along the attachment.  Clinically the tendon appears to be intact.  Equinus is present.  There is no deficit noted on the Achilles tendon.  There is no significant pain on plantar fascia and plantar aspect of heel.  There is no pain with lateral compression of calcaneus.  Decreased medial  arch height.    No images are attached to the encounter.      Assessment:   1. Tendonitis, Achilles, left      Plan:  Patient was evaluated and treated and all questions answered.  Assessment and Plan Assessment & Plan We discussed with conservative as well as surgical intervention.  Given longevity of her symptoms have ordered MRI of the left ankle to further evaluate the pathology.  For now continue with home stretching, rehab exercises as well as night splint, shoes, good arch support.  Will reevaluate pending the MRI results and discuss further treatment options.   Return in about 2 months (around 04/06/2024) for MRI results .  Donnice JONELLE Fees DPM

## 2024-02-11 NOTE — Telephone Encounter (Signed)
 The phone number for scheduling is [phone]

## 2024-02-12 NOTE — Therapy (Incomplete)
 OUTPATIENT PHYSICAL THERAPY LOWER EXTREMITY EVALUATION   Patient Name: Chloe Castillo MRN: [id number] DOB:10/12/1980, 43 y.o., female Today's Date: 02/12/2024  END OF SESSION:   Past Medical History:  Diagnosis Date   Fibroids    Hypertension    Morbidly obese (HCC)    Past Surgical History:  Procedure Laterality Date   NO PAST SURGERIES     Patient Active Problem List   Diagnosis Date Noted   Fibroids 01/02/2019   BMI 50.0-59.9, adult (HCC) 11/04/2018   Irregular periods/menstrual cycles 11/04/2018   Seasonal allergies 11/04/2018   Bilateral foot pain 10/01/2018   Pre-diabetes 05/06/2018   Elevated blood pressure reading without diagnosis of hypertension 12/24/2016   Hx of migraine headaches 12/24/2016   Class 3 severe obesity due to excess calories with serious comorbidity and body mass index (BMI) of 50.0 to 59.9 in adult 12/24/2016    PCP: no PCP in chart  REFERRING PROVIDER: Gershon Donnice SAUNDERS, DPM  REFERRING DIAG: [phone] (ICD-10-CM) - Tendonitis, Achilles, left  THERAPY DIAG:  No diagnosis found.  Rationale for Evaluation and Treatment: Rehabilitation  ONSET DATE: ***  SUBJECTIVE:   SUBJECTIVE STATEMENT: ***  PERTINENT HISTORY: HTN, migraines  PAIN:  Are you having pain: *** Location/description: *** Best-worst over past week: ***  - aggravating factors: *** - Easing factors: ***    PRECAUTIONS: None  RED FLAGS: {PT Red Flags:29287}   WEIGHT BEARING RESTRICTIONS: No  FALLS:  Has patient fallen in last 6 months? {fallsyesno:27318}  LIVING ENVIRONMENT: Lives with: {[facility] lives with:25569::lives with their family} Lives in: {Lives in:25570} Stairs: {opstairs:27293} Has following equipment at home: {Assistive devices:23999}  OCCUPATION: ***  PLOF: {PLOF:24004}  PATIENT GOALS: ***  NEXT MD VISIT: ***  OBJECTIVE:  Note: Objective measures were completed at Evaluation unless otherwise noted.  DIAGNOSTIC FINDINGS:  MRI pending per  chart review   PATIENT SURVEYS:  LEFS: ***  COGNITION: Overall cognitive status: Within functional limits for tasks assessed     SENSATION: {sensation:27233}  EDEMA:  {edema:24020}  MUSCLE LENGTH: Hamstrings: Right *** deg; Left *** deg Debby test: Right *** deg; Left *** deg Gastroc/soleus: L vs R ***   POSTURE: {posture:25561}  PALPATION: ***  LOWER EXTREMITY ROM:  ROM Right eval Left eval  Knee flexion    Knee extension    Ankle dorsiflexion    Ankle plantarflexion    Ankle inversion    Ankle eversion     (Blank rows = not tested) (Key: WFL = within functional limits not formally assessed, * = concordant pain, s = stiffness/stretching sensation, NT = not tested) Comments:   LOWER EXTREMITY MMT:  MMT Right eval Left eval  Knee flexion    Knee extension    Ankle dorsiflexion    Ankle plantarflexion    Ankle inversion    Ankle eversion     (Blank rows = not tested) (Key: WFL = within functional limits not formally assessed, * = concordant pain, s = stiffness/stretching sensation, NT = not tested) Comment:   LOWER EXTREMITY SPECIAL TESTS:  {LEspecialtests:26242}  FUNCTIONAL TESTS:  30secSTS/5xSTS: *** TUG: *** Single/double heel raise: ***   GAIT: Distance walked: within clinic Assistive device utilized: {Assistive devices:23999} Level of assistance: {Levels of assistance:24026} Comments: ***  TREATMENT DATE:  [facility] Adult PT Treatment:                                                DATE: 02/13/24 Therapeutic Exercise: *** Manual Therapy: *** Neuromuscular re-ed: *** Therapeutic Activity: *** Modalities: *** Self Care: ***     PATIENT EDUCATION:  Education details: *** Person educated: {Person educated:25204} Education method: {Education Method:25205} Education comprehension: {Education  Comprehension:25206}  HOME EXERCISE PROGRAM: ***  ASSESSMENT:  CLINICAL IMPRESSION: Patient is a 43 y.o. woman who was seen today for physical therapy evaluation and treatment for L achilles pain. ***    OBJECTIVE IMPAIRMENTS: {opptimpairments:25111}.   ACTIVITY LIMITATIONS: {activitylimitations:27494}  PARTICIPATION LIMITATIONS: {participationrestrictions:25113}  PERSONAL FACTORS: {Personal factors:25162} are also affecting patient's functional outcome.   REHAB POTENTIAL: {rehabpotential:25112}  CLINICAL DECISION MAKING: {clinical decision making:25114}  EVALUATION COMPLEXITY: {Evaluation complexity:25115}   GOALS: Goals reviewed with patient? {yes/no:20286}  SHORT TERM GOALS: Target date: ***  Pt will demonstrate appropriate understanding and performance of initially prescribed HEP in order to facilitate improved independence with management of symptoms.  Baseline: HEP ***  Goal status: INITIAL   2. Pt will report at least 25% improvement in overall pain levels over past week in order to facilitate improved tolerance to typical daily activities.   Baseline: ***  Goal status: INITIAL    LONG TERM GOALS: Target date: ***  Pt will score *** or greater on LEFS in order to demonstrate improved perception of function due to symptoms (MCID 9 pts) Baseline: *** Goal status: INITIAL  2.  Pt will demonstrate at least *** degrees of *** AROM in order to facilitate improved tolerance to functional movements such as ***.  Baseline: see ROM chart above Goal status: INITIAL  3.  Pt will be able to lift up to *** in order to demonstrate improved capacity for daily activities such as ***.  Baseline: *** Goal status: INITIAL  4.  Pt will be able to perform 5xSTS in less than or equal to *** in order to demonstrate reduced fall risk and improved functional independence (MCID 5xSTS = 2.3 sec). Baseline: *** Goal status: INITIAL    PLAN:  PT FREQUENCY: {rehab  frequency:25116}  PT DURATION: {rehab duration:25117}  PLANNED INTERVENTIONS: {rehab planned interventions:25118::97110-Therapeutic exercises,97530- Therapeutic [id number]- Neuromuscular re-education,97535- Self Rjmz,02859- Manual therapy}  PLAN FOR NEXT SESSION: Review/update HEP PRN. Work on [employer] exercises as appropriate with emphasis on ***. Symptom modification strategies as indicated/appropriate.    Alm DELENA Jenny PT, DPT 02/12/2024 1:50 PM

## 2024-02-13 ENCOUNTER — Ambulatory Visit: Admitting: Physical Therapy

## 2024-02-13 NOTE — Telephone Encounter (Signed)
 Noted NPSG M[facility] pending new auth since the other one has expire.

## 2024-02-18 ENCOUNTER — Ambulatory Visit (INDEPENDENT_AMBULATORY_CARE_PROVIDER_SITE_OTHER): Admitting: Neurology

## 2024-02-18 ENCOUNTER — Telehealth: Payer: Self-pay | Admitting: Neurology

## 2024-02-18 DIAGNOSIS — H538 Other visual disturbances: Secondary | ICD-10-CM

## 2024-02-18 DIAGNOSIS — R351 Nocturia: Secondary | ICD-10-CM

## 2024-02-18 DIAGNOSIS — Z9189 Other specified personal risk factors, not elsewhere classified: Secondary | ICD-10-CM

## 2024-02-18 DIAGNOSIS — R0683 Snoring: Secondary | ICD-10-CM

## 2024-02-18 DIAGNOSIS — R55 Syncope and collapse: Secondary | ICD-10-CM

## 2024-02-18 DIAGNOSIS — G4733 Obstructive sleep apnea (adult) (pediatric): Secondary | ICD-10-CM

## 2024-02-18 DIAGNOSIS — G4719 Other hypersomnia: Secondary | ICD-10-CM

## 2024-02-18 DIAGNOSIS — G4720 Circadian rhythm sleep disorder, unspecified type: Secondary | ICD-10-CM

## 2024-02-18 DIAGNOSIS — R42 Dizziness and giddiness: Secondary | ICD-10-CM

## 2024-02-18 DIAGNOSIS — R519 Headache, unspecified: Secondary | ICD-10-CM

## 2024-02-18 DIAGNOSIS — G4734 Idiopathic sleep related nonobstructive alveolar hypoventilation: Secondary | ICD-10-CM

## 2024-02-18 DIAGNOSIS — R0681 Apnea, not elsewhere classified: Secondary | ICD-10-CM

## 2024-02-18 NOTE — Telephone Encounter (Signed)
 NPSG [facility] [place]: [id number] (exp. 02/13/24 to 05/06/24)

## 2024-02-18 NOTE — Telephone Encounter (Signed)
 NPSG [facility] [place]: [id number] (exp. 02/13/24 to 05/06/24)   Patient is scheduled at [facility] for 02/18/24 at 8 pm.  Sent mychart

## 2024-02-20 ENCOUNTER — Ambulatory Visit: Attending: Podiatry | Admitting: Physical Therapy

## 2024-02-20 DIAGNOSIS — M6281 Muscle weakness (generalized): Secondary | ICD-10-CM | POA: Insufficient documentation

## 2024-02-20 DIAGNOSIS — M7662 Achilles tendinitis, left leg: Secondary | ICD-10-CM | POA: Insufficient documentation

## 2024-02-20 NOTE — Therapy (Incomplete)
 OUTPATIENT PHYSICAL THERAPY LOWER EXTREMITY EVALUATION   Patient Name: Chloe Castillo MRN: [id number] DOB:[birth year], 43 y.o., female Today's Date: 02/20/2024  END OF SESSION:   Past Medical History:  Diagnosis Date   Fibroids    Hypertension    Morbidly obese (HCC)    Past Surgical History:  Procedure Laterality Date   NO PAST SURGERIES     Patient Active Problem List   Diagnosis Date Noted   Fibroids 01/02/2019   BMI 50.0-59.9, adult (HCC) 11/04/2018   Irregular periods/menstrual cycles 11/04/2018   Seasonal allergies 11/04/2018   Bilateral foot pain 10/01/2018   Pre-diabetes 05/06/2018   Elevated blood pressure reading without diagnosis of hypertension 12/24/2016   Hx of migraine headaches 12/24/2016   Class 3 severe obesity due to excess calories with serious comorbidity and body mass index (BMI) of 50.0 to 59.9 in adult 12/24/2016    PCP: no pcp per pt   REFERRING PROVIDER: Gershon Donnice SAUNDERS, DPM  REFERRING DIAG: [phone] (ICD-10-CM) - Tendonitis, Achilles, left   Rationale for Evaluation and Treatment: Rehabilitation  THERAPY DIAG:  No diagnosis found.  PERTINENT HISTORY: ***  WEIGHT BEARING RESTRICTIONS: {Yes ***/No:24003}  FALLS:  Has patient fallen in last 6 months? {fallsyesno:27318}  LIVING ENVIRONMENT: Lives with: {[facility] lives with:25569::lives with their family} Lives in: {Lives in:[address]70} Stairs: {opstairs:27293} Has following equipment at home: {Assistive devices:23999}  OCCUPATION: ***   PRECAUTIONS: {Therapy precautions:24002} ---------------------------------------------------------------------------------------------  SUBJECTIVE:   SUBJECTIVE STATEMENT: Eval statement 02/20/2024: ***  RED FLAGS: {PT Red Flags:29287}   PLOF: {PLOF:24004}  PATIENT GOALS: ***  NEXT MD VISIT: *** ---------------------------------------------------------------------------------------------  OBJECTIVE:  Note: Objective measures were completed at  Evaluation unless otherwise noted.  DIAGNOSTIC FINDINGS: ***  PATIENT SURVEYS:  {rehab surveys:24030}  COGNITION: Overall cognitive status: {cognition:24006}     SENSATION: {sensation:27233}  EDEMA:  ***  MUSCLE LENGTH: Hamstrings: Right *** deg; Left *** deg Debby test: Right *** deg; Left *** deg  POSTURE: {posture:25561}  PALPATION: ***  LOWER EXTREMITY ROM:  {AROM/PROM:27142} ROM Right eval Left eval  Hip flexion    Hip extension    Hip abduction    Hip adduction    Hip internal rotation    Hip external rotation    Knee flexion    Knee extension    Ankle dorsiflexion    Ankle plantarflexion    Ankle inversion    Ankle eversion     (Blank rows = not tested)  ! Indicates pain with testing  LOWER EXTREMITY MMT:  MMT Right eval Left eval  Hip flexion    Hip extension    Hip abduction    Hip adduction    Hip internal rotation    Hip external rotation    Knee flexion    Knee extension    Ankle dorsiflexion    Ankle plantarflexion    Ankle inversion    Ankle eversion     (Blank rows = not tested)  ! Indicates pain with testing  LOWER EXTREMITY SPECIAL TESTS:  {LEspecialtests:26242}  FUNCTIONAL TESTS:  {Functional tests:24029}  GAIT: Distance walked: *** Assistive device utilized: {Assistive devices:23999} Level of assistance: {Levels of assistance:24026} Comments: ***  [facility] Adult PT Treatment:                                                DATE: 02/20/2024  Therapeutic Exercise: *** Manual Therapy: *** Neuromuscular re-ed: *** Therapeutic Activity: *** Modalities: *** Self Care: Pt education, detailed below POC discussion    PATIENT EDUCATION:  Education details: Pt received education regarding HEP performance, ADL performance, functional activity tolerance, impairment education, appropriate  performance of therapeutic activities.*** Person educated: {Person educated:25204} Education method: {Education Method:25205} Education comprehension: {Education Comprehension:25206}  HOME EXERCISE PROGRAM: *** ---------------------------------------------------------------------------------------------  ASSESSMENT:  CLINICAL IMPRESSION: Eval impression (02/20/2024): Pt. attended today's physical therapy session for evaluation of ***. Pt has complaints of ***. Pt has notable deficits with ***.  Signs and symptoms are concurrent with ***. Pt would benefit from therapeutic focus on ***.  Treatment performed today focused on *** Pt demonstrated *** understanding of education provided. required *** cues and *** assistance for appropriate performance with today's activities. Pt requires the intervention of skilled outpatient physical therapy to address the aforementioned deficits and progress towards a functional level in line with therapeutic goals.   OBJECTIVE IMPAIRMENTS: {opptimpairments:25111}.   ACTIVITY LIMITATIONS: {activitylimitations:27494}  PARTICIPATION LIMITATIONS: {participationrestrictions:25113}  PERSONAL FACTORS: {Personal factors:25162} are also affecting patient's functional outcome.   REHAB POTENTIAL: {rehabpotential:25112}  CLINICAL DECISION MAKING: {clinical decision making:25114}  EVALUATION COMPLEXITY: {Evaluation complexity:25115}   GOALS: Goals reviewed with patient? YES  SHORT TERM GOALS: Target date: *** Pt will be independent with administered HEP to demonstrate the competency necessary for long term managemnet of symptoms at home.  Baseline: Goal status: INITIAL  2.  *** Baseline:  Goal status: INITIAL  3.  *** Baseline:  Goal status: INITIAL  4.  *** Baseline:  Goal status: INITIAL  5.  *** Baseline:  Goal status: INITIAL  6.  *** Baseline:  Goal status: INITIAL  LONG TERM GOALS: Target date: ***  Pt. Will achieve a LEFS score of  *** as to demonstrate improvement in self-perceived functional ability with daily activities.  Baseline:  Goal status: INITIAL  2.  Pt will improve Global Hip/knee strength to a ***/5 to demonstrate improvement in strength for quality of motion and activity performance.  Baseline:  Goal status: INITIAL  3.  *** Baseline:  Goal status: INITIAL  4.  *** Baseline:  Goal status: INITIAL  5.  *** Baseline:  Goal status: INITIAL  6.  *** Baseline:  Goal status: INITIAL  --------------------------------------------------------------------------------------------- PLAN:  PT FREQUENCY: 1-2x/week  PT DURATION: 6 weeks  PLANNED INTERVENTIONS: {rehab planned interventions:25118::97110-Therapeutic exercises,97530- Therapeutic [phone]- Neuromuscular re-education,97535- Self Rjmz,02859- Manual therapy}  PLAN FOR NEXT SESSION: Review HEP, Begin POC as detailed in the assessment   Mabel Kiang, PT, DPT 02/20/2024, 4:01 PM

## 2024-02-21 ENCOUNTER — Ambulatory Visit: Payer: Self-pay | Admitting: Neurology

## 2024-02-21 DIAGNOSIS — G4733 Obstructive sleep apnea (adult) (pediatric): Secondary | ICD-10-CM

## 2024-02-21 DIAGNOSIS — G4734 Idiopathic sleep related nonobstructive alveolar hypoventilation: Secondary | ICD-10-CM

## 2024-02-21 NOTE — Procedures (Signed)
 Physician Interpretation:     [facility] POLYSOMNOGRAPHY  INTERPRETATION REPORT   STUDY DATE:  02/18/2024     PATIENT NAME:  Chloe Castillo         DATE OF BIRTH:  April 05, 1981  PATIENT ID:  [id number]    TYPE OF STUDY:  PSG  READING PHYSICIAN: TRUE MAR, MD, PhD   SCORING TECHNICIAN: Donnice Counts, RPSGT     Referred by: ER ? History and Indication for Testing: 43 year old female with an underlying medical history of hypertension, fibroids, morbid obesity, who reports intermittent feeling of lightheadedness, and recurrent headaches. She reports snoring and witnessed apneas. She has woken up with a headache. She has nocturia about once per average night. Her Epworth sleepiness score is 15 out of 24, fatigue severity score is 9 out of 63.  Height: 63 in Weight: 354 lb (BMI 62) Neck Size: 21 in    MEDICATIONS: Tylenol, Zestril , Voltaren    TECHNICAL DESCRIPTION: A registered sleep technologist was in attendance for the duration of the recording.  Data collection, scoring, video monitoring, and reporting were performed in compliance with the AASM Manual for the Scoring of Sleep and Associated Events; (Hypopnea is scored based on the criteria listed in Section VIII D. 1b in the AASM Manual V2.6 using a 4% oxygen desaturation rule or Hypopnea is scored based on the criteria listed in Section VIII D. 1a in the AASM Manual V2.6 using 3% oxygen desaturation and /or arousal rule).   SLEEP CONTINUITY AND SLEEP ARCHITECTURE:  Lights-out was at 21:53: and lights-on at  05:05:, with a total recording time of 7 hours, 12.5 min. Total sleep time ( TST) was 235.5 minutes with a decreased sleep efficiency at 54.5%.   BODY POSITION:  TST was divided  between the following sleep positions: 0.0% supine;  37.2% lateral;  63% prone. Duration of total sleep and percent of total sleep in their respective position is as follows: supine 00 minutes (0%), non-supine 236 minutes  (100%); right 69 minutes (29%), left 18 minutes (8%), and prone 148 minutes (63%).  Total supine REM sleep time was 00 minutes (0% of total REM sleep).  Sleep latency was normal at 23.5 minutes. Of the total sleep time, the percentage of stage N1 sleep was 18.3%, stage N2 sleep was 82%, stage N3 sleep and REM sleep were absent. Wake after sleep onset (WASO) time accounted for 173.5 minutes with moderate to severe sleep fragmentation noted.   RESPIRATORY MONITORING:   Based on CMS criteria (using a 4% oxygen desaturation rule for scoring hypopneas), there were 37 apneas (37 obstructive; 0 central; 0 mixed), and 33 hypopneas.  Apnea index was 9.4. Hypopnea index was 8.4. The apnea-hypopnea index was 17.8 overall (0.0 supine, 0 non-supine; 0.0 REM, 0.0 supine REM).  There were 0 respiratory effort-related arousals (RERAs).  The RERA index was 0 events/h. Total respiratory disturbance index (RDI) was 17.8 events/h. RDI results showed: supine RDI  0.0 /h; non-supine RDI 17.8 /h; REM RDI 0.0 /h, supine REM RDI 0.0 /h.   Based on AASM criteria (using a 3% oxygen desaturation and /or arousal rule for scoring hypopneas), there were 37 apneas (37 obstructive; 0 central; 0 mixed), and 100 hypopneas. Apnea index was 9.4. Hypopnea index was 25.5. The apnea-hypopnea index was 34.9 overall (0.0 supine, 0 non-supine; 0.0 REM, 0.0 supine REM).  There were 0 respiratory effort-related arousals (RERAs).  The RERA index was 0 events/h. Total respiratory disturbance index (RDI) was 34.9 events/h. RDI  results showed: supine RDI  0.0 /h; non-supine RDI 34.9 /h; REM RDI 0.0 /h, supine REM RDI 0.0 /h.  OXIMETRY: Oxyhemoglobin Saturation Nadir during sleep was at  75% from a mean of 95%.  Of the Total sleep time (TST)   hypoxemia (=<88%) was present for  36.9 minutes, or 15.7% of total sleep time. supplemental O2 was started at 1 lpm at 22:52, due to persistent lower oxygen saturations, with an average of 86%. LIMB MOVEMENTS:  There were 0 periodic limb movements of sleep (0.0/hr), of which 0 (0.0/hr) were associated with an arousal.   AROUSAL: There were 147 arousals in total, for an arousal index of 37 arousals/hour.  Of these, 108 were identified as respiratory-related arousals (28 /h), 0 were PLM-related arousals (0 /h), and 67 were non-specific arousals (17 /h).    EEG: Review of the EEG showed no abnormal electrical discharges and symmetrical bihemispheric findings.      EKG: The EKG revealed normal sinus rhythm (NSR). The average heart rate during sleep was 72 bpm.     AUDIO/VIDEO REVIEW: The audio and video review did not show any abnormal or unusual behaviors, movements, phonations or vocalizations. The patient took 2 restroom breaks. Snoring was noted, in the mild to moderate range.    POST-STUDY QUESTIONNAIRE: Post study, the patient indicated, that sleep was better than usual.    IMPRESSION:    1. Moderate Obstructive Sleep Apnea (OSA) 2. Nocturnal hypoxemia  3. Dysfunctions associated with sleep stages or arousal from sleep  RECOMMENDATIONS:   1. This study demonstrates moderate obstructive sleep apnea with a total AHI of 17.8/h.  She was noted to have lower oxygen saturations in the first part of the night and was placed on supplemental oxygen at 1 L/min for her nocturnal hypoxemia.  The absence of supine sleep as well as absence of REM sleep during the study likely underestimate her sleep disordered breathing. Treatment with a positive airway pressure (PAP) device is recommended.  The patient will be advised to proceed with home autoPAP therapy for now.  A laboratory attended, full night PAP-titration study can be considered down the road, to optimize therapy settings, mask fit, monitoring of tolerance and of proper oxygen saturations. Other treatment options may be limited, and may include (generally speaking) surgical options in selected patients. Concomitant weight loss is highly recommended.  Please note that untreated obstructive sleep apnea may carry additional perioperative morbidity. Patients with significant obstructive sleep apnea should receive perioperative PAP therapy and the surgeons and particularly the anesthesiologist should be informed of the diagnosis and the severity of the sleep disordered breathing. 2. This study shows sleep fragmentation and abnormal sleep stage percentages; these are nonspecific findings and per se do not signify an intrinsic sleep disorder or a cause for the patient's sleep-related symptoms. Causes include (but are not limited to) the first night effect of the sleep study, circadian rhythm disturbances, medication effect or an underlying mood disorder or medical problem.  3. The patient should be cautioned not to drive, work at heights, or operate dangerous or heavy equipment when tired or sleepy. Review and reiteration of good sleep hygiene measures should be pursued with any patient. 4. The patient will be seen in follow-up in the sleep clinic at [facility] for discussion of the test results, symptom and treatment compliance review, further management strategies, etc. The patient and the referring provider will be notified of the test results. I certify that I have reviewed the entire raw data recording prior  to the issuance of this report in accordance with the Standards of Accreditation of the American Academy of Sleep Medicine (AASM).  True Mar, MD, PhD Medical Director, [facility]) Diplomat, ABPN (Neurology and Sleep)               Technical Report:   General Information  Name: Kema, Santaella BMI: 62.71 Physician: TRUE MAR, MD  ID: [id number] Height: 63.0 in Technician: Donnice Counts, RPSGT  Sex: Female Weight: 354.0 lb Record: [id number]  Age: 83 [09-07-1980] Date: 02/18/2024    Medical & Medication History    Ms. Oyer is a 43 year old female with an underlying medical history of  hypertension, fibroids, morbid obesity, who reports intermittent feeling of lightheadedness for the past 3 weeks. Reports that she often has a headache with it. She has not been treated for her headaches by her PCP yet. She has establish with a new family medicine primary care but does not know the name of her provider. She presented to the emergency room on 10/23/2023 with dizziness as well as headache and blurry vision. She reports snoring and witnessed apneas. She has woken up with a headache. She has nocturia about once per average night. Her Epworth sleepiness score is 15 out of 24, fatigue severity score is 9 out of 63. She lives with 3 of her 6 children. She works full-time as an International aid/development worker at [employer]. She is a non-smoker and does not drink alcohol. She drinks caffeine in the form of sweet tea, about 2 to 3 cups/day, occasional soda, very occasional iced tea. She drinks about 3 bottles of water per day. She denies any sudden onset one-sided weakness or numbness or tingling or droopy face or slurring of speech but has had intermittent blurry vision. She had contact lenses in the past but they were irritating her eyes so she stopped using them. She did not go back for eyeglasses. She feels that her left eye is worse compared to her right. She has not had any loss of vision. She has not passed out or fallen to the ground. She states that 2 of her daughters have migraines. She is not aware of any family history of sleep apnea.  Tylenol, Zestril , Voltaren    Sleep Disorder      Comments   Patient arrived for a diagnostic polysomnogram. Procedure explained and all questions answered. Pt was shown CPAP at 5cm with a medium Vitera full face mask prior to light out in possible preparation for a SPLIT night study. Patient tolerated CPAP at 5cm with a full face mask. Standard paste setup without complications. Patient slept supine, left, prone, and right. At 22:55 hours, 1/LPM oxygen was added for SAO2 values  at or below 88% for greater than five consecutive minutes. Mild to moderate intermittent snoring heard. Respiratory events observed. After 2 hours total sleep time, AHI = 37.5. No significant cardiac arrhythmias observed. No significant PLMS observed. Two restroom visits.    Lights out: 09:53:22 PM Lights on: 05:05:57 AM   Time Total Supine Side Prone Upright  Recording (TRT) 7h 12.24m 0h 0.69m 3h 22.39m 3h 50.11m 0h 0.6m  Sleep (TST) 3h 55.9m 0h 0.42m 1h 27.72m 2h 28.79m 0h 0.12m   Latency N1 N2 N3 REM Onset Per. Slp. Eff.  Actual 0h 0.36m 0h 1.43m 0h 0.16m 0h 0.6m 0h 23.54m 0h 25.64m 54.45%   Stg Dur Wake N1 N2 N3 REM  Total 197.0 43.0 192.5 0.0 0.0  Supine 0.0 0.0 0.0 0.0  0.0  Side 114.5 15.0 72.5 0.0 0.0  Prone 82.5 28.0 120.0 0.0 0.0  Upright 0.0 0.0 0.0 0.0 0.0   Stg % Wake N1 N2 N3 REM  Total 45.5 18.3 81.7 0.0 0.0  Supine 0.0 0.0 0.0 0.0 0.0  Side 26.5 6.4 30.8 0.0 0.0  Prone 19.1 11.9 51.0 0.0 0.0  Upright 0.0 0.0 0.0 0.0 0.0     Apnea Summary Sub Supine Side Prone Upright  Total 37 Total 37 0 7 30 0    REM 0 0 0 0 0    NREM 37 0 7 30 0  Obs 37 REM 0 0 0 0 0    NREM 37 0 7 30 0  Mix 0 REM 0 0 0 0 0    NREM 0 0 0 0 0  Cen 0 REM 0 0 0 0 0    NREM 0 0 0 0 0   Rera Summary Sub Supine Side Prone Upright  Total 0 Total 0 0 0 0 0    REM 0 0 0 0 0    NREM 0 0 0 0 0   Hypopnea Summary Sub Supine Side Prone Upright  Total 100 Total 100 0 40 60 0    REM 0 0 0 0 0    NREM 100 0 40 60 0   4% Hypopnea Summary Sub Supine Side Prone Upright  Total (4%) 33 Total 33 0 14 19 0    REM 0 0 0 0 0    NREM 33 0 14 19 0     AHI Total Obs Mix Cen  34.90 Apnea 9.43 9.43 0.00 0.00   Hypopnea 25.48 -- -- --  17.83 Hypopnea (4%) 8.41 -- -- --    Total Supine Side Prone Upright  Position AHI 34.90 0.00 32.23 36.49 0.00  REM AHI 0.00   NREM AHI 34.90   Position RDI 34.90 0.00 32.23 36.49 0.00  REM RDI 0.00   NREM RDI 34.90    4% Hypopnea Total Supine Side Prone Upright  Position AHI (4%)  17.83 0.00 14.40 19.86 0.00  REM AHI (4%) 0.00   NREM AHI (4%) 17.83   Position RDI (4%) 17.83 0.00 14.40 19.86 0.00  REM RDI (4%) 0.00   NREM RDI (4%) 17.83    Desaturation Information Threshold: 2% <100% <90% <80% <70% <60% <50% <40%  Supine 0.0 0.0 0.0 0.0 0.0 0.0 0.0  Side 195.0 55.0 0.0 0.0 0.0 0.0 0.0  Prone 200.0 34.0 6.0 1.0 0.0 0.0 0.0  Upright 0.0 0.0 0.0 0.0 0.0 0.0 0.0  Total 395.0 89.0 6.0 1.0 0.0 0.0 0.0  Index 58.2 13.1 0.9 0.1 0.0 0.0 0.0   Threshold: 3% <100% <90% <80% <70% <60% <50% <40%  Supine 0.0 0.0 0.0 0.0 0.0 0.0 0.0  Side 127.0 42.0 0.0 0.0 0.0 0.0 0.0  Prone 91.0 34.0 6.0 1.0 0.0 0.0 0.0  Upright 0.0 0.0 0.0 0.0 0.0 0.0 0.0  Total 218.0 76.0 6.0 1.0 0.0 0.0 0.0  Index 32.1 11.2 0.9 0.1 0.0 0.0 0.0   Threshold: 4% <100% <90% <80% <70% <60% <50% <40%  Supine 0.0 0.0 0.0 0.0 0.0 0.0 0.0  Side 83.0 34.0 0.0 0.0 0.0 0.0 0.0  Prone 64.0 34.0 6.0 1.0 0.0 0.0 0.0  Upright 0.0 0.0 0.0 0.0 0.0 0.0 0.0  Total 147.0 68.0 6.0 1.0 0.0 0.0 0.0  Index 21.6 10.0 0.9 0.1 0.0 0.0 0.0   Threshold: 3% <100% <90% <80% <70% <  60% <50% <40%  Supine 0 0 0 0 0 0 0  Side 127 42 0 0 0 0 0  Prone 91 34 6 1 0 0 0  Upright 0 0 0 0 0 0 0  Total 218 76 6 1 0 0 0   Awakening/Arousal Information # of Awakenings 47  Wake after sleep onset 173.34m  Wake after persistent sleep 173.55m   Arousal Assoc. Arousals Index  Apneas 32 8.2  Hypopneas 76 19.4  Leg Movements 0 0.0  Snore 0 0.0  PTT Arousals 0 0.0  Spontaneous 67 17.1  Total 175 44.6  Leg Movement Information PLMS LMs Index  Total LMs during PLMS 0 0.0  LMs w/ Microarousals 0 0.0   LM LMs Index  w/ Microarousal 0 0.0  w/ Awakening 0 0.0  w/ Resp Event 1 0.3  Spontaneous 1 0.3  Total 2 0.5     Desaturation threshold setting: 3% Minimum desaturation setting: 10 seconds SaO2 nadir: 51% The longest event was a 30 sec obstructive Hypopnea with a minimum SaO2 of 95%. The lowest SaO2 was 66% associated with a 11 sec  obstructive Apnea. EKG Rates EKG Avg Max Min  Awake 74 101 64  Asleep 72 86 63  EKG Events: Tachycardia

## 2024-02-28 ENCOUNTER — Inpatient Hospital Stay (HOSPITAL_COMMUNITY): Admission: RE | Admit: 2024-02-28 | Source: Ambulatory Visit

## 2024-02-28 ENCOUNTER — Ambulatory Visit (HOSPITAL_COMMUNITY)

## 2024-02-29 ENCOUNTER — Encounter: Payer: Self-pay | Admitting: Podiatry

## 2024-03-03 ENCOUNTER — Ambulatory Visit

## 2024-03-03 ENCOUNTER — Inpatient Hospital Stay: Admission: RE | Admit: 2024-03-03 | Source: Ambulatory Visit

## 2024-03-03 NOTE — Therapy (Incomplete)
 OUTPATIENT PHYSICAL THERAPY LOWER EXTREMITY EVALUATION   Patient Name: Chloe Castillo MRN: [id number] DOB:[birth year], 43 y.o., female Today's Date: 03/03/2024  END OF SESSION:   Past Medical History:  Diagnosis Date   Fibroids    Hypertension    Morbidly obese (HCC)    Past Surgical History:  Procedure Laterality Date   NO PAST SURGERIES     Patient Active Problem List   Diagnosis Date Noted   Fibroids 01/02/2019   BMI 50.0-59.9, adult (HCC) 11/04/2018   Irregular periods/menstrual cycles 11/04/2018   Seasonal allergies 11/04/2018   Bilateral foot pain 10/01/2018   Pre-diabetes 05/06/2018   Elevated blood pressure reading without diagnosis of hypertension 12/24/2016   Hx of migraine headaches 12/24/2016   Class 3 severe obesity due to excess calories with serious comorbidity and body mass index (BMI) of 50.0 to 59.9 in adult 12/24/2016    PCP: Siganporia, Arnaz, FNP  REFERRING PROVIDER: Gershon Donnice SAUNDERS, DPM  REFERRING DIAG: [phone] (ICD-10-CM) - Tendonitis, Achilles, left   THERAPY DIAG:  No diagnosis found.  Rationale for Evaluation and Treatment: Rehabilitation  ONSET DATE: ***  SUBJECTIVE:   SUBJECTIVE STATEMENT: ***  PERTINENT HISTORY: *** PAIN:  Are you having pain?  Yes: NPRS scale: *** Pain location: *** Pain description: *** Aggravating factors: *** Relieving factors: ***  PRECAUTIONS: {Therapy precautions:24002}  RED FLAGS: {PT Red Flags:29287}   WEIGHT BEARING RESTRICTIONS: {Yes ***/No:24003}  FALLS:  Has patient fallen in last 6 months? {fallsyesno:27318}  LIVING ENVIRONMENT: Lives with: {OPRC lives with:25569::lives with their family} Lives in: {Lives in:25570} Stairs: {opstairs:27293} Has following equipment at home: {Assistive devices:23999}  OCCUPATION: ***  PLOF: {PLOF:24004}  PATIENT GOALS: ***  NEXT MD VISIT: ***  OBJECTIVE:  Note: Objective measures were completed at Evaluation unless otherwise  noted.  DIAGNOSTIC FINDINGS: ***  PATIENT SURVEYS:  {rehab surveys:24030}  COGNITION: Overall cognitive status: {cognition:24006}     SENSATION: {sensation:27233}  EDEMA:  {edema:24020}  MUSCLE LENGTH: Hamstrings: Right *** deg; Left *** deg Debby test: Right *** deg; Left *** deg  POSTURE: {posture:25561}  PALPATION: ***  LOWER EXTREMITY ROM:  {AROM/PROM:27142} ROM Right eval Left eval  Hip flexion    Hip extension    Hip abduction    Hip adduction    Hip internal rotation    Hip external rotation    Knee flexion    Knee extension    Ankle dorsiflexion    Ankle plantarflexion    Ankle inversion    Ankle eversion     (Blank rows = not tested)  LOWER EXTREMITY MMT:  MMT Right eval Left eval  Hip flexion    Hip extension    Hip abduction    Hip adduction    Hip internal rotation    Hip external rotation    Knee flexion    Knee extension    Ankle dorsiflexion    Ankle plantarflexion    Ankle inversion    Ankle eversion     (Blank rows = not tested)  LOWER EXTREMITY SPECIAL TESTS:  {LEspecialtests:26242}  FUNCTIONAL TESTS:  {Functional tests:24029}  GAIT: Distance walked: *** Assistive device utilized: {Assistive devices:23999} Level of assistance: {Levels of assistance:24026} Comments: ***  TREATMENT DATE: ***    PATIENT EDUCATION:  Education details: *** Person educated: {Person educated:25204} Education method: {Education Method:25205} Education comprehension: {Education Comprehension:25206}  HOME EXERCISE PROGRAM: ***  ASSESSMENT:  CLINICAL IMPRESSION: Patient is a *** y.o. *** who was seen today for physical therapy evaluation and treatment for ***.   OBJECTIVE IMPAIRMENTS: {opptimpairments:25111}.   ACTIVITY LIMITATIONS: {activitylimitations:27494}  PARTICIPATION LIMITATIONS:  {participationrestrictions:25113}  PERSONAL FACTORS: {Personal factors:25162} are also affecting patient's functional outcome.   REHAB POTENTIAL: {rehabpotential:25112}  CLINICAL DECISION MAKING: {clinical decision making:25114}  EVALUATION COMPLEXITY: {Evaluation complexity:25115}   GOALS: Goals reviewed with patient? No  SHORT TERM GOALS: Target date: 03/24/2024   Pt will be compliant and knowledgeable with initial HEP for improved comfort and carryover Baseline: initial HEP given  Goal status: INITIAL  2.  Pt will self report *** pain no greater than ***/10 for improved comfort and functional ability Baseline: ***/10 at worst Goal status: {GOALSTATUS:25110}   LONG TERM GOALS: Target date: 04/28/2024   Pt will improve LEFS to no less than ***/80 as proxy for functional improvement with home ADLs and higher level community activity Baseline: ***/80 Goal status: {GOALSTATUS:25110}  2.  Pt will self report *** pain no greater than ***/10 for improved comfort and functional ability Baseline: ***/10 at worst Goal status: {GOALSTATUS:25110}   3.  Pt will increase 30 Second Sit to Stand rep count to no less than *** reps for improved balance, strength, and functional mobility Baseline: *** reps  Goal status: {GOALSTATUS:25110}   4.  *** Baseline:  Goal status: INITIAL  5.  *** Baseline:  Goal status: INITIAL  6.  *** Baseline:  Goal status: INITIAL   PLAN:  PT FREQUENCY: {rehab frequency:25116}  PT DURATION: {rehab duration:25117}  PLANNED INTERVENTIONS: {rehab planned interventions:25118::97110-Therapeutic exercises,97530- Therapeutic [phone]- Neuromuscular re-education,97535- Self Rjmz,02859- Manual therapy}  PLAN FOR NEXT SESSION: PIERRETTE Alm JAYSON Johna, PT 03/03/2024, 7:54 AM

## 2024-03-05 ENCOUNTER — Telehealth: Payer: Self-pay | Admitting: *Deleted

## 2024-03-05 NOTE — Telephone Encounter (Signed)
 Pt has called for the results to her sleep study

## 2024-03-05 NOTE — Telephone Encounter (Signed)
 Pt returning a call for her sleep study results. Please call [phone]

## 2024-03-05 NOTE — Telephone Encounter (Signed)
 Tried to reach patient. Phone rang multiple times then got message saying call could not be completed.

## 2024-03-05 NOTE — Progress Notes (Signed)
 Tried to reach patient again. Phone rang multiple times then got message saying call could not be completed.

## 2024-03-05 NOTE — Progress Notes (Signed)
 Patient returned our calls.  We discussed her sleep study results as noted below.  Patient verbalized understanding and is in agreement to start AutoPap.  She asked for results to be sent over to her primary care which I have done.  Her questions were answered. Patient scheduled 30-90 day f/u for June 04, 2024 at 9:45 am. Pt had an appt early September but that was supposed to occur after testing done. This has been r/s to October. Pt's MRI still pending for late September (will be sedated). Pt will watch for a call from [facility] for setup.   Order sent to [facility].

## 2024-03-05 NOTE — Telephone Encounter (Signed)
 Pt aware of insurance compliance requirements which includes using the machine at least 4 hours at night and being seen by our office between 30 and 90 days after setup.

## 2024-03-06 ENCOUNTER — Ambulatory Visit

## 2024-03-10 NOTE — Telephone Encounter (Signed)
 Zott, Glade Boring, Heather CROME, RN; Osgood, [place]; Darrel, Verneita Got It Thank You

## 2024-03-11 ENCOUNTER — Encounter: Payer: Self-pay | Admitting: Podiatry

## 2024-03-11 ENCOUNTER — Ambulatory Visit (INDEPENDENT_AMBULATORY_CARE_PROVIDER_SITE_OTHER): Admitting: Podiatry

## 2024-03-11 DIAGNOSIS — M7662 Achilles tendinitis, left leg: Secondary | ICD-10-CM | POA: Diagnosis not present

## 2024-03-11 MED ORDER — NITROGLYCERIN 0.2 MG/HR TD PT24: 0.2000 mg | MEDICATED_PATCH | Freq: Every day | TRANSDERMAL | 1 refills | Status: AC

## 2024-03-11 NOTE — Progress Notes (Signed)
  Subjective:  Patient ID: Chloe Castillo, female    DOB: 24-Dec-1980,  MRN: [id number]  Chief Complaint  Patient presents with   Foot Pain    Rm13 left heel pain f/u with no improvement/ pt complains of sharp throbbing pain.     History of Present Illness The patient, with a history of a work-related foot injury, presents for follow-up evaluation of chronic left heel pain.  She reports that she never did physical therapy.  She still describes a sharp sensation as well as pain.  No recent injuries.    Objective:    Physical Exam General: AAO x3, NAD  Dermatological: Skin is warm, dry and supple bilateral.  There are no open sores, no preulcerative lesions, no rash or signs of infection present.  Vascular: Dorsalis Pedis artery and Posterior Tibial artery pedal pulses are 2/4 bilateral with immedate capillary fill time.  There is no pain with calf compression, swelling, warmth, erythema.   Neruologic: Grossly intact via light touch bilateral.  Negative Tinel sign.  Musculoskeletal: There is still localized tenderness along the posterior aspect the calcaneus on the distal portion of the Achilles tendon and along the attachment.  Clinically the tendon appears to be intact without any deficit in.  Equinus is present. There is no significant pain on plantar fascia and plantar aspect of heel.  There is no pain with lateral compression of calcaneus.  Decreased medial arch height.    No images are attached to the encounter.      Assessment:   1. Tendonitis, Achilles, left      Plan:  Patient was evaluated and treated and all questions answered.  Assessment and Plan Assessment & Plan  - We discussed the conservative as well as surgical management to physical therapy.  Discussed home stretching, rehab exercises.  Described Nitropatch.  When she is aware.  If needed return to cam boot.  Return for MRI results.  Chloe Castillo DPM

## 2024-03-12 ENCOUNTER — Ambulatory Visit
Admission: RE | Admit: 2024-03-12 | Discharge: 2024-03-12 | Disposition: A | Discharge status: Home / Self Care | Source: Ambulatory Visit | Attending: Nurse Practitioner | Admitting: Nurse Practitioner

## 2024-03-12 DIAGNOSIS — R928 Other abnormal and inconclusive findings on diagnostic imaging of breast: Secondary | ICD-10-CM

## 2024-03-12 NOTE — Therapy (Unsigned)
 OUTPATIENT PHYSICAL THERAPY LOWER EXTREMITY EVALUATION   Patient Name: Chloe Castillo MRN: [id number] DOB:02/09/1981, 43 y.o., female Today's Date: 03/13/2024  END OF SESSION:  PT End of Session - 03/13/24 1707     Visit Number 1    Number of Visits 12    Date for PT Re-Evaluation 05/14/24    Authorization Type MCD    PT Start Time 1700    PT Stop Time 1745    PT Time Calculation (min) 45 min    Activity Tolerance Patient tolerated treatment well;Patient limited by pain    Behavior During Therapy [facility] for tasks assessed/performed          Past Medical History:  Diagnosis Date   Fibroids    Hypertension    Morbidly obese (HCC)    Past Surgical History:  Procedure Laterality Date   NO PAST SURGERIES     Patient Active Problem List   Diagnosis Date Noted   Fibroids 01/02/2019   BMI 50.0-59.9, adult (HCC) 11/04/2018   Irregular periods/menstrual cycles 11/04/2018   Seasonal allergies 11/04/2018   Bilateral foot pain 10/01/2018   Pre-diabetes 05/06/2018   Elevated blood pressure reading without diagnosis of hypertension 12/24/2016   Hx of migraine headaches 12/24/2016   Class 3 severe obesity due to excess calories with serious comorbidity and body mass index (BMI) of 50.0 to 59.9 in adult 12/24/2016    PCP: Siganporia, Arnaz, FNP PCP - General  REFERRING PROVIDER: Gershon Donnice SAUNDERS, DPM  REFERRING DIAG: [phone] (ICD-10-CM) - Tendonitis, Achilles, left  THERAPY DIAG:  Achilles tendinitis, left leg  Muscle weakness (generalized)  Rationale for Evaluation and Treatment: Rehabilitation  ONSET DATE: chronic  SUBJECTIVE:   SUBJECTIVE STATEMENT: Patient relates a several year history of L heel/achilles pain.  Treated conservatively with night splint, medication and compression sock.  PERTINENT HISTORY: The patient, with a history of a work-related foot injury, presents for follow-up evaluation of chronic left heel pain. The pain, which has been present for  several years, is intermittent and worsens with cold weather. The patient reports a previous slip and fall injury at work in 2016-2017, but did not seek treatment at the time as she did not notice any immediate symptoms. Recently, she received an injection for the pain, which provided temporary relief, but the pain has since returned. The patient also notes that she tends to put more weight on the left foot, which may be contributing to the pain.   Today she presents for follow-up.  She states that she still having pain.  Not sure if the steroids were helpful. PAIN:  Are you having pain? Yes: NPRS scale: 9.5/10 Pain location: L achilles Pain description: ache Aggravating factors: standing and WB Relieving factors: rest  PRECAUTIONS: None  RED FLAGS: None   WEIGHT BEARING RESTRICTIONS: No  FALLS:  Has patient fallen in last 6 months? No  OCCUPATION: cashier  PLOF: Independent  PATIENT GOALS: To manage my ankle symptoms  NEXT MD VISIT: 04/08/24  OBJECTIVE:  Note: Objective measures were completed at Evaluation unless otherwise noted.  DIAGNOSTIC FINDINGS: none to date  PATIENT SURVEYS:  LEFS 10/80  MUSCLE LENGTH: N/T  POSTURE: not tested  PALPATION: L achilles insertion  LOWER EXTREMITY ROM:  A/PROM Right eval Left eval  Hip flexion    Hip extension    Hip abduction    Hip adduction    Hip internal rotation    Hip external rotation    Knee flexion    Knee extension  Ankle dorsiflexion  15/20d  Ankle plantarflexion  WFL  Ankle inversion  WFL  Ankle eversion  WFL   (Blank rows = not tested)  LOWER EXTREMITY MMT:  MMT Right eval Left eval  Hip flexion    Hip extension    Hip abduction    Hip adduction    Hip internal rotation    Hip external rotation    Knee flexion    Knee extension    Ankle dorsiflexion    Ankle plantarflexion 4 3  Ankle inversion    Ankle eversion     (Blank rows = not tested)  LOWER EXTREMITY SPECIAL TESTS:  Ankle  special tests: Tinel's test-Deep peroneal: negative  FUNCTIONAL TESTS:  30 seconds chair stand test 11 (limited by fatigue vs ankle pain)  SLS 20s B  GAIT: Distance walked: 38ftx2 Assistive device utilized: None Level of assistance: Complete Independence Comments: slow cadence                                                                                                                                TREATMENT:  [facility] Adult PT Treatment:                                                DATE: 03/13/24 Eval and HEP Self Care: Additional minutes spent for educating on updated Therapeutic Home Exercise Program as well as comparing current status to condition at start of symptoms. This included exercises focusing on stretching, strengthening, with focus on eccentric aspects. Long term goals include an improvement in range of motion, strength, endurance as well as avoiding reinjury. Patient's frequency would include in 1-2 times a day, 3-5 times a week for a duration of 6-12 weeks. Proper technique shown and discussed handout in great detail. All questions were discussed and addressed.      PATIENT EDUCATION:  Education details: Discussed eval findings, rehab rationale and POC and patient is in agreement  Person educated: Patient Education method: Explanation and Handouts Education comprehension: verbalized understanding and needs further education  HOME EXERCISE PROGRAM: Access Code: [email or web address] URL: [email or web address] Date: 03/13/2024 Prepared by: Reyes Kohut  Exercises - Standing Eccentric Heel Raise  - 2 x daily - 5 x weekly - 3 sets - 10 reps - Seated Heel Toe Raises  - 2 x daily - 5 x weekly - 3 sets - 10 reps - Ankle Inversion Eversion Towel Slide  - 2 x daily - 5 x weekly - 3 sets - 10 reps  ASSESSMENT:  CLINICAL IMPRESSION: Patient is a 43 y.o. female who was seen today for physical therapy evaluation and treatment for chronic L achilles tendinitis. Patient  demonstrates good ROM but strength deficits due to pain.  TTP at Achilles insertion, SLS time equal B.  Recommend OPPT fo eccentric based strengthening and  functional training.   OBJECTIVE IMPAIRMENTS: Abnormal gait, decreased activity tolerance, decreased knowledge of condition, decreased strength, obesity, and pain.   ACTIVITY LIMITATIONS: standing, squatting, and stairs  PERSONAL FACTORS: Age, Fitness, and Time since onset of injury/illness/exacerbation are also affecting patient's functional outcome.   REHAB POTENTIAL: Fair based on chronicity and body habitus  CLINICAL DECISION MAKING: Stable/uncomplicated  EVALUATION COMPLEXITY: Low   GOALS: Goals reviewed with patient? No  SHORT TERM GOALS: Target date: 04/03/2024   Patient to demonstrate independence in HEP  Baseline: [id number] Goal status: INITIAL  2.  Patient to perform 1 L SL heel raise Baseline: 0 Goal status: INITIAL   LONG TERM GOALS: Target date: 04/24/2024  Patient will score at least 20/80 on LEFS to signify clinically meaningful improvement in functional abilities.   Baseline: 10/80 Goal status: INITIAL  2.  6/10 pain Baseline: 9.5/10 Goal status: INITIAL  3.  3+/5 L PF strength Baseline: 3/5 Goal status: INITIAL     PLAN:  PT FREQUENCY: 1-2x/week  PT DURATION: 6 weeks  PLANNED INTERVENTIONS: 97110-Therapeutic exercises, 97530- Therapeutic activity, 97112- Neuromuscular re-education, 97535- Self Care, 02859- Manual therapy, [phone]- Gait training, Patient/Family education, Balance training, and Stair training  PLAN FOR NEXT SESSION: HEP review and update, manual techniques as appropriate, aerobic tasks, ROM and flexibility activities, strengthening and PREs, TPDN, gait and balance training as needed   For all possible CPT codes, reference the Planned Interventions line above.     Check all conditions that are expected to impact treatment: {Conditions expected to impact treatment:Morbid  obesity   If treatment provided at initial evaluation, no treatment charged due to lack of authorization.        Adyson Vanburen M Jakyron Fabro, PT 03/13/2024, 5:10 PM

## 2024-03-13 ENCOUNTER — Other Ambulatory Visit: Payer: Self-pay

## 2024-03-13 ENCOUNTER — Ambulatory Visit

## 2024-03-13 DIAGNOSIS — M6281 Muscle weakness (generalized): Secondary | ICD-10-CM

## 2024-03-13 DIAGNOSIS — M7662 Achilles tendinitis, left leg: Secondary | ICD-10-CM

## 2024-03-18 ENCOUNTER — Other Ambulatory Visit (HOSPITAL_COMMUNITY)

## 2024-03-18 ENCOUNTER — Encounter (HOSPITAL_COMMUNITY)

## 2024-03-19 NOTE — Therapy (Deleted)
 OUTPATIENT PHYSICAL THERAPY LOWER EXTREMITY EVALUATION   Patient Name: Chloe Castillo MRN: [id number] DOB:07/05/1981, 43 y.o., female Today's Date: 03/19/2024  END OF SESSION:    Past Medical History:  Diagnosis Date   Fibroids    Hypertension    Morbidly obese (HCC)    Past Surgical History:  Procedure Laterality Date   NO PAST SURGERIES     Patient Active Problem List   Diagnosis Date Noted   Fibroids 01/02/2019   BMI 50.0-59.9, adult (HCC) 11/04/2018   Irregular periods/menstrual cycles 11/04/2018   Seasonal allergies 11/04/2018   Bilateral foot pain 10/01/2018   Pre-diabetes 05/06/2018   Elevated blood pressure reading without diagnosis of hypertension 12/24/2016   Hx of migraine headaches 12/24/2016   Class 3 severe obesity due to excess calories with serious comorbidity and body mass index (BMI) of 50.0 to 59.9 in adult 12/24/2016    PCP: Siganporia, Arnaz, FNP PCP - General  REFERRING PROVIDER: Gershon Donnice SAUNDERS, DPM  REFERRING DIAG: [phone] (ICD-10-CM) - Tendonitis, Achilles, left  THERAPY DIAG:  No diagnosis found.  Rationale for Evaluation and Treatment: Rehabilitation  ONSET DATE: chronic  SUBJECTIVE:   SUBJECTIVE STATEMENT: Patient relates a several year history of L heel/achilles pain.  Treated conservatively with night splint, medication and compression sock.  PERTINENT HISTORY: The patient, with a history of a work-related foot injury, presents for follow-up evaluation of chronic left heel pain. The pain, which has been present for several years, is intermittent and worsens with cold weather. The patient reports a previous slip and fall injury at work in 2016-2017, but did not seek treatment at the time as she did not notice any immediate symptoms. Recently, she received an injection for the pain, which provided temporary relief, but the pain has since returned. The patient also notes that she tends to put more weight on the left foot, which may be  contributing to the pain.   Today she presents for follow-up.  She states that she still having pain.  Not sure if the steroids were helpful. PAIN:  Are you having pain? Yes: NPRS scale: 9.5/10 Pain location: L achilles Pain description: ache Aggravating factors: standing and WB Relieving factors: rest  PRECAUTIONS: None  RED FLAGS: None   WEIGHT BEARING RESTRICTIONS: No  FALLS:  Has patient fallen in last 6 months? No  OCCUPATION: cashier  PLOF: Independent  PATIENT GOALS: To manage my ankle symptoms  NEXT MD VISIT: 04/08/24  OBJECTIVE:  Note: Objective measures were completed at Evaluation unless otherwise noted.  DIAGNOSTIC FINDINGS: none to date  PATIENT SURVEYS:  LEFS 10/80  MUSCLE LENGTH: N/T  POSTURE: not tested  PALPATION: L achilles insertion  LOWER EXTREMITY ROM:  A/PROM Right eval Left eval  Hip flexion    Hip extension    Hip abduction    Hip adduction    Hip internal rotation    Hip external rotation    Knee flexion    Knee extension    Ankle dorsiflexion  15/20d  Ankle plantarflexion  WFL  Ankle inversion  WFL  Ankle eversion  WFL   (Blank rows = not tested)  LOWER EXTREMITY MMT:  MMT Right eval Left eval  Hip flexion    Hip extension    Hip abduction    Hip adduction    Hip internal rotation    Hip external rotation    Knee flexion    Knee extension    Ankle dorsiflexion    Ankle plantarflexion 4 3  Ankle  inversion    Ankle eversion     (Blank rows = not tested)  LOWER EXTREMITY SPECIAL TESTS:  Ankle special tests: Tinel's test-Deep peroneal: negative  FUNCTIONAL TESTS:  30 seconds chair stand test 11 (limited by fatigue vs ankle pain)  SLS 20s B  GAIT: Distance walked: 55ftx2 Assistive device utilized: None Level of assistance: Complete Independence Comments: slow cadence                                                                                                                                 TREATMENT:  [facility] Adult PT Treatment:                                                DATE: 03/13/24 Eval and HEP Self Care: Additional minutes spent for educating on updated Therapeutic Home Exercise Program as well as comparing current status to condition at start of symptoms. This included exercises focusing on stretching, strengthening, with focus on eccentric aspects. Long term goals include an improvement in range of motion, strength, endurance as well as avoiding reinjury. Patient's frequency would include in 1-2 times a day, 3-5 times a week for a duration of 6-12 weeks. Proper technique shown and discussed handout in great detail. All questions were discussed and addressed.      PATIENT EDUCATION:  Education details: Discussed eval findings, rehab rationale and POC and patient is in agreement  Person educated: Patient Education method: Explanation and Handouts Education comprehension: verbalized understanding and needs further education  HOME EXERCISE PROGRAM: Access Code: [email or web address] URL: [email or web address] Date: 03/13/2024 Prepared by: Reyes Kohut  Exercises - Standing Eccentric Heel Raise  - 2 x daily - 5 x weekly - 3 sets - 10 reps - Seated Heel Toe Raises  - 2 x daily - 5 x weekly - 3 sets - 10 reps - Ankle Inversion Eversion Towel Slide  - 2 x daily - 5 x weekly - 3 sets - 10 reps  ASSESSMENT:  CLINICAL IMPRESSION: Patient is a 43 y.o. female who was seen today for physical therapy evaluation and treatment for chronic L achilles tendinitis. Patient demonstrates good ROM but strength deficits due to pain.  TTP at Achilles insertion, SLS time equal B.  Recommend OPPT fo eccentric based strengthening and functional training.   OBJECTIVE IMPAIRMENTS: Abnormal gait, decreased activity tolerance, decreased knowledge of condition, decreased strength, obesity, and pain.   ACTIVITY LIMITATIONS: standing, squatting, and stairs  PERSONAL FACTORS: Age, Fitness, and  Time since onset of injury/illness/exacerbation are also affecting patient's functional outcome.   REHAB POTENTIAL: Fair based on chronicity and body habitus  CLINICAL DECISION MAKING: Stable/uncomplicated  EVALUATION COMPLEXITY: Low   GOALS: Goals reviewed with patient? No  SHORT TERM GOALS: Target date: 04/03/2024   Patient to demonstrate independence in HEP  Baseline:  [id number] Goal status: INITIAL  2.  Patient to perform 1 L SL heel raise Baseline: 0 Goal status: INITIAL   LONG TERM GOALS: Target date: 04/24/2024  Patient will score at least 20/80 on LEFS to signify clinically meaningful improvement in functional abilities.   Baseline: 10/80 Goal status: INITIAL  2.  6/10 pain Baseline: 9.5/10 Goal status: INITIAL  3.  3+/5 L PF strength Baseline: 3/5 Goal status: INITIAL     PLAN:  PT FREQUENCY: 1-2x/week  PT DURATION: 6 weeks  PLANNED INTERVENTIONS: 97110-Therapeutic exercises, 97530- Therapeutic activity, 97112- Neuromuscular re-education, 97535- Self Care, 02859- Manual therapy, [phone]- Gait training, Patient/Family education, Balance training, and Stair training  PLAN FOR NEXT SESSION: HEP review and update, manual techniques as appropriate, aerobic tasks, ROM and flexibility activities, strengthening and PREs, TPDN, gait and balance training as needed   For all possible CPT codes, reference the Planned Interventions line above.     Check all conditions that are expected to impact treatment: {Conditions expected to impact treatment:Morbid obesity   If treatment provided at initial evaluation, no treatment charged due to lack of authorization.        Eryc Bodey M Derian Dimalanta, PT 03/19/2024, 4:13 PM

## 2024-03-20 ENCOUNTER — Telehealth: Payer: Self-pay

## 2024-03-20 ENCOUNTER — Ambulatory Visit

## 2024-03-20 NOTE — Telephone Encounter (Signed)
 TC due to missed visit.  Spoke directly to patient who had forgotten appointment.  Reminded of next appointment date and time as well as attendance policy.

## 2024-03-25 NOTE — Therapy (Deleted)
 OUTPATIENT PHYSICAL THERAPY LOWER EXTREMITY EVALUATION   Patient Name: Chloe Castillo MRN: [id number] DOB:1980/12/09, 43 y.o., female Today's Date: 03/25/2024  END OF SESSION:    Past Medical History:  Diagnosis Date   Fibroids    Hypertension    Morbidly obese (HCC)    Past Surgical History:  Procedure Laterality Date   NO PAST SURGERIES     Patient Active Problem List   Diagnosis Date Noted   Fibroids 01/02/2019   BMI 50.0-59.9, adult (HCC) 11/04/2018   Irregular periods/menstrual cycles 11/04/2018   Seasonal allergies 11/04/2018   Bilateral foot pain 10/01/2018   Pre-diabetes 05/06/2018   Elevated blood pressure reading without diagnosis of hypertension 12/24/2016   Hx of migraine headaches 12/24/2016   Class 3 severe obesity due to excess calories with serious comorbidity and body mass index (BMI) of 50.0 to 59.9 in adult 12/24/2016    PCP: Siganporia, Arnaz, FNP PCP - General  REFERRING PROVIDER: Gershon Donnice SAUNDERS, DPM  REFERRING DIAG: [phone] (ICD-10-CM) - Tendonitis, Achilles, left  THERAPY DIAG:  No diagnosis found.  Rationale for Evaluation and Treatment: Rehabilitation  ONSET DATE: chronic  SUBJECTIVE:   SUBJECTIVE STATEMENT: Patient relates a several year history of L heel/achilles pain.  Treated conservatively with night splint, medication and compression sock.  PERTINENT HISTORY: The patient, with a history of a work-related foot injury, presents for follow-up evaluation of chronic left heel pain. The pain, which has been present for several years, is intermittent and worsens with cold weather. The patient reports a previous slip and fall injury at work in 2016-2017, but did not seek treatment at the time as she did not notice any immediate symptoms. Recently, she received an injection for the pain, which provided temporary relief, but the pain has since returned. The patient also notes that she tends to put more weight on the left foot, which may be  contributing to the pain.   Today she presents for follow-up.  She states that she still having pain.  Not sure if the steroids were helpful. PAIN:  Are you having pain? Yes: NPRS scale: 9.5/10 Pain location: L achilles Pain description: ache Aggravating factors: standing and WB Relieving factors: rest  PRECAUTIONS: None  RED FLAGS: None   WEIGHT BEARING RESTRICTIONS: No  FALLS:  Has patient fallen in last 6 months? No  OCCUPATION: cashier  PLOF: Independent  PATIENT GOALS: To manage my ankle symptoms  NEXT MD VISIT: 04/08/24  OBJECTIVE:  Note: Objective measures were completed at Evaluation unless otherwise noted.  DIAGNOSTIC FINDINGS: none to date  PATIENT SURVEYS:  LEFS 10/80  MUSCLE LENGTH: N/T  POSTURE: not tested  PALPATION: L achilles insertion  LOWER EXTREMITY ROM:  A/PROM Right eval Left eval  Hip flexion    Hip extension    Hip abduction    Hip adduction    Hip internal rotation    Hip external rotation    Knee flexion    Knee extension    Ankle dorsiflexion  15/20d  Ankle plantarflexion  WFL  Ankle inversion  WFL  Ankle eversion  WFL   (Blank rows = not tested)  LOWER EXTREMITY MMT:  MMT Right eval Left eval  Hip flexion    Hip extension    Hip abduction    Hip adduction    Hip internal rotation    Hip external rotation    Knee flexion    Knee extension    Ankle dorsiflexion    Ankle plantarflexion 4 3  Ankle  inversion    Ankle eversion     (Blank rows = not tested)  LOWER EXTREMITY SPECIAL TESTS:  Ankle special tests: Tinel's test-Deep peroneal: negative  FUNCTIONAL TESTS:  30 seconds chair stand test 11 (limited by fatigue vs ankle pain)  SLS 20s B  GAIT: Distance walked: 30ftx2 Assistive device utilized: None Level of assistance: Complete Independence Comments: slow cadence                                                                                                                                 TREATMENT:  [facility] Adult PT Treatment:                                                DATE: 03/13/24 Eval and HEP Self Care: Additional minutes spent for educating on updated Therapeutic Home Exercise Program as well as comparing current status to condition at start of symptoms. This included exercises focusing on stretching, strengthening, with focus on eccentric aspects. Long term goals include an improvement in range of motion, strength, endurance as well as avoiding reinjury. Patient's frequency would include in 1-2 times a day, 3-5 times a week for a duration of 6-12 weeks. Proper technique shown and discussed handout in great detail. All questions were discussed and addressed.      PATIENT EDUCATION:  Education details: Discussed eval findings, rehab rationale and POC and patient is in agreement  Person educated: Patient Education method: Explanation and Handouts Education comprehension: verbalized understanding and needs further education  HOME EXERCISE PROGRAM: Access Code: [email or web address] URL: [email or web address] Date: 03/13/2024 Prepared by: Reyes Kohut  Exercises - Standing Eccentric Heel Raise  - 2 x daily - 5 x weekly - 3 sets - 10 reps - Seated Heel Toe Raises  - 2 x daily - 5 x weekly - 3 sets - 10 reps - Ankle Inversion Eversion Towel Slide  - 2 x daily - 5 x weekly - 3 sets - 10 reps  ASSESSMENT:  CLINICAL IMPRESSION: Patient is a 43 y.o. female who was seen today for physical therapy evaluation and treatment for chronic L achilles tendinitis. Patient demonstrates good ROM but strength deficits due to pain.  TTP at Achilles insertion, SLS time equal B.  Recommend OPPT fo eccentric based strengthening and functional training.   OBJECTIVE IMPAIRMENTS: Abnormal gait, decreased activity tolerance, decreased knowledge of condition, decreased strength, obesity, and pain.   ACTIVITY LIMITATIONS: standing, squatting, and stairs  PERSONAL FACTORS: Age, Fitness, and  Time since onset of injury/illness/exacerbation are also affecting patient's functional outcome.   REHAB POTENTIAL: Fair based on chronicity and body habitus  CLINICAL DECISION MAKING: Stable/uncomplicated  EVALUATION COMPLEXITY: Low   GOALS: Goals reviewed with patient? No  SHORT TERM GOALS: Target date: 04/03/2024   Patient to demonstrate independence in HEP  Baseline:  [id number] Goal status: INITIAL  2.  Patient to perform 1 L SL heel raise Baseline: 0 Goal status: INITIAL   LONG TERM GOALS: Target date: 04/24/2024  Patient will score at least 20/80 on LEFS to signify clinically meaningful improvement in functional abilities.   Baseline: 10/80 Goal status: INITIAL  2.  6/10 pain Baseline: 9.5/10 Goal status: INITIAL  3.  3+/5 L PF strength Baseline: 3/5 Goal status: INITIAL     PLAN:  PT FREQUENCY: 1-2x/week  PT DURATION: 6 weeks  PLANNED INTERVENTIONS: 97110-Therapeutic exercises, 97530- Therapeutic activity, 97112- Neuromuscular re-education, 97535- Self Care, 02859- Manual therapy, [phone]- Gait training, Patient/Family education, Balance training, and Stair training  PLAN FOR NEXT SESSION: HEP review and update, manual techniques as appropriate, aerobic tasks, ROM and flexibility activities, strengthening and PREs, TPDN, gait and balance training as needed   For all possible CPT codes, reference the Planned Interventions line above.     Check all conditions that are expected to impact treatment: {Conditions expected to impact treatment:Morbid obesity   If treatment provided at initial evaluation, no treatment charged due to lack of authorization.        Greidys Deland M Fradel Baldonado, PT 03/25/2024, 1:29 PM

## 2024-03-26 ENCOUNTER — Ambulatory Visit

## 2024-03-28 ENCOUNTER — Ambulatory Visit

## 2024-04-08 ENCOUNTER — Encounter: Payer: Self-pay | Admitting: Podiatry

## 2024-04-08 ENCOUNTER — Ambulatory Visit (INDEPENDENT_AMBULATORY_CARE_PROVIDER_SITE_OTHER): Admitting: Podiatry

## 2024-04-08 DIAGNOSIS — M7662 Achilles tendinitis, left leg: Secondary | ICD-10-CM

## 2024-04-08 NOTE — Progress Notes (Unsigned)
F/u mri

## 2024-04-24 ENCOUNTER — Ambulatory Visit: Admitting: Neurology

## 2024-04-30 NOTE — Telephone Encounter (Signed)
 Pt's MRI is with sedation. Radiology dept requires H&P form be completed before appt. MRI will be on 9/25 so this has to be completed before then. Please call patient and schedule her with any NP or Dr Buck before Thursday 9/25.

## 2024-05-01 ENCOUNTER — Telehealth: Payer: Self-pay

## 2024-05-01 NOTE — Telephone Encounter (Signed)
 Called PT ,Appt scheduled

## 2024-05-01 NOTE — Telephone Encounter (Signed)
 I spoke with Heather at [facility] and they received a H&P request as well. The patient is scheduled for an appointment to complete this next week.

## 2024-05-05 ENCOUNTER — Telehealth: Payer: Self-pay | Admitting: Neurology

## 2024-05-05 NOTE — Telephone Encounter (Signed)
 [employer] medicaid shara: [id number] exp. 05/05/24-06/19/24 for Chloe Castillo.

## 2024-05-06 NOTE — Progress Notes (Deleted)
 Patient: Chloe Castillo Date of Birth: 11-27-1980  Reason for Visit: Follow up History from: Patient Primary Neurologist: Buck   ASSESSMENT AND PLAN 43 y.o. year old female    HISTORY OF PRESENT ILLNESS: Today 05/06/24   HISTORY  10/30/23 Dr. Buck: I saw patient, [place], as a referral from the emergency room for initial consultation of her near syncope.  The patient is unaccompanied today.  Ms. Glaeser is a 43 year old female with an underlying medical history of hypertension, fibroids, morbid obesity, who reports intermittent feeling of lightheadedness for the past 3 weeks.  Reports that she often has a headache with it.  She has not been treated for her headaches by her PCP yet.  She has establish with a new family medicine primary care but does not know the name of her provider.  She presented to the emergency room on 10/23/2023 with dizziness as well as headache and blurry vision.  I reviewed the emergency room records.  Her blood pressure was elevated at 153/87 at the time.  She had a CT venogram of the head with contrast on 10/23/2023 and I reviewed the results:   IMPRESSION: 1. Limited CTV without convincing evidence of acute intracranial abnormality or dural venous sinus thrombosis. An MRI head with and without contrast may provide better evaluation if clinically warranted. 2. Small left transverse and sigmoid sinus and partially empty sella, which can be associated with idiopathic intracranial hypertension in the correct clinical setting.   She had a head CT without contrast on 12/05/2019 and I reviewed the results:   IMPRESSION: 1.  No acute intracranial pathology.   2. 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 MRI may be helpful to further evaluate.   She reports snoring and witnessed apneas.  She has woken up with a headache.  She has nocturia about once per average night.  Her Epworth  sleepiness score is 15 out of 24, fatigue severity score is 9 out of 63.  She lives with 3 of her 6 children.  She works full-time as an International aid/development worker at [employer].  She is a non-smoker and does not drink alcohol.  She drinks caffeine in the form of sweet tea, about 2 to 3 cups/day, occasional soda, very occasional iced tea.  She drinks about 3 bottles of water per day.  She denies any sudden onset one-sided weakness or numbness or tingling or droopy face or slurring of speech but has had intermittent blurry vision.  She had contact lenses in the past but they were irritating her eyes so she stopped using them.  She did not go back for eyeglasses. She feels that her left eye is worse compared to her right.  She has not had any loss of vision.  She has not passed out or fallen to the ground.  She states that 2 of her daughters have migraines.  She is not aware of any family history of sleep apnea.   I had evaluated her for syncope in the past.  I had ordered a brain MRI but she did not have it done.  I had also ordered a sleep study and EEG, she did not have testing done.  She was advised to follow-up with PCP and discuss referral to a cardiologist.  She was lost to follow-up  REVIEW OF SYSTEMS: Out of a complete 14 system review of symptoms, the patient complains only of the following symptoms,  and all other reviewed systems are negative.  See HPI  ALLERGIES: Allergies  Allergen Reactions   Pollen Extract     HOME MEDICATIONS: Outpatient Medications Prior to Visit  Medication Sig Dispense Refill   acetaminophen (TYLENOL) 500 MG tablet Take 1,000 mg by mouth every 6 (six) hours as needed (headache).     celecoxib  (CELEBREX ) 100 MG capsule Take 1 capsule (100 mg total) by mouth 2 (two) times daily. 30 capsule 1   diclofenac  Sodium (VOLTAREN ) 1 % GEL Apply 2 g topically 4 (four) times daily. 100 g 0   lisinopril  (ZESTRIL ) 20 MG tablet Take 1 tablet by mouth daily.     methylPREDNISolone  (MEDROL   DOSEPAK) 4 MG TBPK tablet Take as directed 21 tablet 0   nitroGLYCERIN  (NITRO-DUR ) 0.2 mg/hr patch Place 1 patch (0.2 mg total) onto the skin daily. Apply to Achilles tendon 30 patch 1   Semaglutide -Weight Management (WEGOVY ) 0.25 MG/0.5ML SOAJ Inject 0.25 mg into the skin once a week. (Patient not taking: Reported on 04/08/2024) 2 mL 0   Semaglutide -Weight Management (WEGOVY ) 0.25 MG/0.5ML SOAJ Inject 0.25 mg into the skin once a week. 2 mL 0   Semaglutide -Weight Management (WEGOVY ) 0.5 MG/0.5ML SOAJ Inject 0.5 mLs (0.5 mg dose) into the skin once a week. (Patient not taking: Reported on 04/08/2024) 2 mL 1   Semaglutide -Weight Management (WEGOVY ) 0.5 MG/0.5ML SOAJ Inject 0.5 mg into the skin once a week. 2 mL 1   No facility-administered medications prior to visit.    PAST MEDICAL HISTORY: Past Medical History:  Diagnosis Date   Fibroids    Hypertension    Morbidly obese (HCC)     PAST SURGICAL HISTORY: Past Surgical History:  Procedure Laterality Date   NO PAST SURGERIES      FAMILY HISTORY: Family History  Problem Relation Age of Onset   Hypertension Mother    Hypertension Father    Migraines Daughter    Migraines Daughter    Heart Problems Other     SOCIAL HISTORY: Social History   Socioeconomic History   Marital status: Married    Spouse name: Not on file   Number of children: 6   Years of education: Not on file   Highest education level: Not on file  Occupational History   Not on file  Tobacco Use   Smoking status: Never   Smokeless tobacco: Never  Vaping Use   Vaping status: Never Used  Substance and Sexual Activity   Alcohol use: No   Drug use: No   Sexual activity: Never    Birth control/protection: None  Other Topics Concern   Not on file  Social History Narrative   Lives at home with her children. Her husband is in the [place]   Right handed   Caffeine: 2 red bull  at most in a day, on average has 1 per day, occasionally will have a soda    Social Drivers of Corporate investment banker Strain: Low Risk  (12/26/2023)   Received from [facility]   Overall Financial Resource Strain (CARDIA)    Difficulty of Paying Living Expenses: Not hard at all  Food Insecurity: Low Risk  (04/23/2024)   Received from [facility]   Hunger Vital Sign    Within the past 12 months, you worried that your food would run out before you got money to buy more: Never true    Within the past 12 months, the food you bought just didn't last and you didn't  have money to get more. : Never true  Transportation Needs: No Transportation Needs (04/23/2024)   Received from [facility]    In the past 12 months, has lack of reliable transportation kept you from medical appointments, meetings, work or from getting things needed for daily living? : No  Physical Activity: Not on file  Stress: Not on file  Social Connections: Not on file  Intimate Partner Violence: Not on file    PHYSICAL EXAM  There were no vitals filed for this visit. There is no height or weight on file to calculate BMI.  Generalized: Well developed, in no acute distress  Neurological examination  Mentation: Alert oriented to time, place, history taking. Follows all commands speech and language fluent Cranial nerve II-XII: Pupils were equal round reactive to light. Extraocular movements were full, visual field were full on confrontational test. Facial sensation and strength were normal. Uvula tongue midline. Head turning and shoulder shrug  were normal and symmetric. Motor: The motor testing reveals 5 over 5 strength of all 4 extremities. Good symmetric motor tone is noted throughout.  Sensory: Sensory testing is intact to soft touch on all 4 extremities. No evidence of extinction is noted.  Coordination: Cerebellar testing reveals good finger-nose-finger and heel-to-shin bilaterally.  Gait and station: Gait is normal. Tandem gait is normal. Romberg is negative. No drift  is seen.  Reflexes: Deep tendon reflexes are symmetric and normal bilaterally.   DIAGNOSTIC DATA (LABS, IMAGING, TESTING) - I reviewed patient records, labs, notes, testing and imaging myself where available.  Lab Results  Component Value Date   WBC 7.3 10/23/2023   HGB 12.3 10/23/2023   HCT 39.7 10/23/2023   MCV 82.4 10/23/2023   PLT 341 10/23/2023      Component Value Date/Time   NA 137 10/23/2023 1637   K 3.8 10/23/2023 1637   CL 101 10/23/2023 1637   CO2 29 10/23/2023 1637   GLUCOSE 97 10/23/2023 1637   BUN 17 10/23/2023 1637   CREATININE 0.97 10/23/2023 1637   CALCIUM 8.9 10/23/2023 1637   PROT 7.3 07/22/2020 1737   ALBUMIN 3.3 (L) 07/22/2020 1737   AST 14 (L) 07/22/2020 1737   ALT 15 07/22/2020 1737   ALKPHOS 52 07/22/2020 1737   BILITOT 0.2 (L) 07/22/2020 1737   GFRNONAA >60 10/23/2023 1637   GFRAA >60 12/05/2019 1347   Lab Results  Component Value Date   CHOL 144 12/19/2016   HDL 36 (L) 12/19/2016   LDLCALC 89 12/19/2016   TRIG 97 12/19/2016   CHOLHDL 4.0 12/19/2016   Lab Results  Component Value Date   HGBA1C 5.5 09/30/2018   No results found for: [id number] Lab Results  Component Value Date   TSH 0.94 12/19/2016    Lauraine Born, AGNP-C, DNP 05/06/2024, 3:12 PM [facility] [address], [address] [place], [place] [address] [phone]

## 2024-05-07 ENCOUNTER — Encounter: Payer: Self-pay | Admitting: Neurology

## 2024-05-07 ENCOUNTER — Ambulatory Visit: Admitting: Neurology

## 2024-05-12 ENCOUNTER — Encounter (HOSPITAL_BASED_OUTPATIENT_CLINIC_OR_DEPARTMENT_OTHER): Payer: Self-pay | Admitting: Emergency Medicine

## 2024-05-12 ENCOUNTER — Emergency Department (HOSPITAL_BASED_OUTPATIENT_CLINIC_OR_DEPARTMENT_OTHER)
Admission: EM | Admit: 2024-05-12 | Discharge: 2024-05-12 | Disposition: A | Discharge status: Home / Self Care | Attending: Emergency Medicine | Admitting: Emergency Medicine

## 2024-05-12 ENCOUNTER — Other Ambulatory Visit: Payer: Self-pay

## 2024-05-12 DIAGNOSIS — Z79899 Other long term (current) drug therapy: Secondary | ICD-10-CM | POA: Diagnosis not present

## 2024-05-12 DIAGNOSIS — R519 Headache, unspecified: Secondary | ICD-10-CM | POA: Insufficient documentation

## 2024-05-12 DIAGNOSIS — I10 Essential (primary) hypertension: Secondary | ICD-10-CM | POA: Diagnosis not present

## 2024-05-12 LAB — COMPREHENSIVE METABOLIC PANEL WITH GFR
ALT: 15 U/L (ref 0–44)
AST: 17 U/L (ref 15–41)
Albumin: 3.8 g/dL (ref 3.5–5.0)
Alkaline Phosphatase: 66 U/L (ref 38–126)
Anion gap: 10 (ref 5–15)
BUN: 14 mg/dL (ref 6–20)
CO2: 26 mmol/L (ref 22–32)
Calcium: 9.3 mg/dL (ref 8.9–10.3)
Chloride: 101 mmol/L (ref 98–111)
Creatinine, Ser: 0.97 mg/dL (ref 0.44–1.00)
GFR, Estimated: >60 mL/min (ref >60)
Glucose, Bld: 95 mg/dL (ref 70–99)
Potassium: 4.5 mmol/L (ref 3.5–5.1)
Sodium: 138 mmol/L (ref 135–145)
Total Bilirubin: 0.3 mg/dL (ref 0.0–1.2)
Total Protein: 7.3 g/dL (ref 6.5–8.1)

## 2024-05-12 LAB — CBC
HCT: 40.7 % (ref 36.0–46.0)
Hemoglobin: 12.5 g/dL (ref 12.0–15.0)
MCH: 25.5 pg — ABNORMAL LOW (ref 26.0–34.0)
MCHC: 30.7 g/dL (ref 30.0–36.0)
MCV: 82.9 fL (ref 80.0–100.0)
Platelets: 302 K/uL (ref 150–400)
RBC: 4.91 MIL/uL (ref 3.87–5.11)
RDW: 15.1 % (ref 11.5–15.5)
WBC: 7 K/uL (ref 4.0–10.5)
nRBC: 0 % (ref 0.0–0.2)

## 2024-05-12 LAB — URINALYSIS, ROUTINE W REFLEX MICROSCOPIC
Bilirubin Urine: NEGATIVE
Glucose, UA: NEGATIVE mg/dL
Hgb urine dipstick: NEGATIVE
Ketones, ur: NEGATIVE mg/dL
Leukocytes,Ua: NEGATIVE
Nitrite: NEGATIVE
Protein, ur: NEGATIVE mg/dL
Specific Gravity, Urine: 1.014 (ref 1.005–1.030)
pH: 6 (ref 5.0–8.0)

## 2024-05-12 LAB — PREGNANCY, URINE: Preg Test, Ur: NEGATIVE

## 2024-05-12 NOTE — ED Notes (Signed)
 Blood obtained by Triage

## 2024-05-12 NOTE — ED Notes (Signed)
 Urine obtained by Triage

## 2024-05-12 NOTE — ED Notes (Signed)
 DC paperwork given and verbally understood.

## 2024-05-12 NOTE — ED Triage Notes (Signed)
 Pt caox4, ambulatory, c/o migraine today, dizziness since yesterday, family reports pt had seemed to be disoriented at home since yesterday.

## 2024-05-12 NOTE — ED Provider Notes (Signed)
 [place] [facility] AT [facility] Provider Note  CSN: [id number] Arrival date & time: 05/12/24 1148  Chief Complaint(s) Migraine  HPI Chloe Castillo is a 43 y.o. female with a past medical history listed below who presents to the emergency department with severe headache upon wakening morning.  Patient reports that yesterday after work, she was feeling unwell and fell asleep without her CPAP.  She awoke after a few hours and family noted that she was altered.  Patient does not remember this.  She fell back asleep without her CPAP again.  This morning when she awoke she went to work still not feeling well and came home around 7 AM.  She fell asleep again without her CPAP and awoke with a severe headache and lightheadedness.  Headache has improved after taking 2 extra strength Tylenol  at home.  Given her confusion yesterday, family recommended she come in for evaluation.  No associated nausea or vomiting.  She denies any fevers or infections.  No focal deficits.  No visual disturbance.  No ataxia.  No chest pain or shortness of breath.  The history is provided by the patient.    Past Medical History Past Medical History:  Diagnosis Date   Fibroids    Hypertension    Morbidly obese [facility])    Patient Active Problem List   Diagnosis Date Noted   Fibroids 01/02/2019   BMI 50.0-59.9, adult (HCC) 11/04/2018   Irregular periods/menstrual cycles 11/04/2018   Seasonal allergies 11/04/2018   Bilateral foot pain 10/01/2018   Pre-diabetes 05/06/2018   Elevated blood pressure reading without diagnosis of hypertension 12/24/2016   Hx of migraine headaches 12/24/2016   Class 3 severe obesity due to excess calories with serious comorbidity and body mass index (BMI) of 50.0 to 59.9 in adult 12/24/2016   Home Medication(s) Prior to Admission medications   Medication Sig Start Date End Date Taking? Authorizing Provider  acetaminophen  (TYLENOL ) 500 MG tablet Take 1,000 mg by mouth  every 6 (six) hours as needed (headache).    [provider]  celecoxib  (CELEBREX ) 100 MG capsule Take 1 capsule (100 mg total) by mouth 2 (two) times daily. 02/05/24   Gershon Donnice SAUNDERS, DPM  diclofenac  Sodium (VOLTAREN ) 1 % GEL Apply 2 g topically 4 (four) times daily. 05/22/22   Beverley Leita LABOR, PA-C  lisinopril  (ZESTRIL ) 20 MG tablet Take 1 tablet by mouth daily. 03/22/17   [provider]  losartan (COZAAR) 100 MG tablet Take 100 mg by mouth daily.    [provider]  methylPREDNISolone  (MEDROL  DOSEPAK) 4 MG TBPK tablet Take as directed 12/06/23   Gershon Donnice SAUNDERS, DPM  nitroGLYCERIN  (NITRO-DUR ) 0.2 mg/hr patch Place 1 patch (0.2 mg total) onto the skin daily. Apply to Achilles tendon 03/11/24   Gershon Donnice SAUNDERS, DPM  Semaglutide -Weight Management (WEGOVY ) 0.25 MG/0.5ML SOAJ Inject 0.25 mg into the skin once a week. Patient not taking: Reported on 04/08/2024 11/06/23     Semaglutide -Weight Management (WEGOVY ) 0.25 MG/0.5ML SOAJ Inject 0.25 mg into the skin once a week. 12/26/23     Semaglutide -Weight Management (WEGOVY ) 0.5 MG/0.5ML SOAJ Inject 0.5 mLs (0.5 mg dose) into the skin once a week. Patient not taking: Reported on 04/08/2024 11/06/23     Semaglutide -Weight Management (WEGOVY ) 0.5 MG/0.5ML SOAJ Inject 0.5 mg into the skin once a week. 12/26/23  Allergies Pollen extract  Review of Systems Review of Systems As noted in HPI  Physical Exam Vital Signs  I have reviewed the triage vital signs BP 123/78   Pulse 62   Temp 98 F (36.7 C) (Oral)   Resp 12   LMP 04/02/2024 (Approximate)   SpO2 97%   Physical Exam Vitals reviewed.  Constitutional:      General: She is not in acute distress.    Appearance: She is well-developed. She is obese. She is not diaphoretic.  HENT:     Head: Normocephalic and atraumatic.     Nose: Nose  normal.  Eyes:     General: No scleral icterus.       Right eye: No discharge.        Left eye: No discharge.     Conjunctiva/sclera: Conjunctivae normal.     Pupils: Pupils are equal, round, and reactive to light.  Cardiovascular:     Rate and Rhythm: Normal rate and regular rhythm.     Heart sounds: No murmur heard.    No friction rub. No gallop.  Pulmonary:     Effort: Pulmonary effort is normal. No respiratory distress.     Breath sounds: Normal breath sounds. No stridor. No rales.  Abdominal:     General: There is no distension.     Palpations: Abdomen is soft.     Tenderness: There is no abdominal tenderness.  Musculoskeletal:        General: No tenderness.     Cervical back: Normal range of motion and neck supple.  Skin:    General: Skin is warm and dry.     Findings: No erythema or rash.  Neurological:     Mental Status: She is alert and oriented to person, place, and time.     Cranial Nerves: Cranial nerves 2-12 are intact.     Sensory: Sensation is intact.     Motor: Motor function is intact.     ED Results and Treatments Labs (all labs ordered are listed, but only abnormal results are displayed) Labs Reviewed  CBC - Abnormal; Notable for the following components:      Result Value   MCH 25.5 (*)    All other components within normal limits  URINALYSIS, ROUTINE W REFLEX MICROSCOPIC - Abnormal; Notable for the following components:   Color, Urine COLORLESS (*)    All other components within normal limits  COMPREHENSIVE METABOLIC PANEL WITH GFR  PREGNANCY, URINE                                                                                                                         EKG  EKG Interpretation Date/Time:  Monday May 12 2024 12:42:49 EDT Ventricular Rate:  69 PR Interval:  169 QRS Duration:  83 QT Interval:  395 QTC Calculation: 424 R Axis:   68  Text Interpretation: Sinus rhythm improved TW changes from prior Confirmed by Trine Likes  [phone]) on 05/12/2024 12:52:39 PM  Radiology No results found.  Medications Ordered in ED Medications - No data to display Procedures Procedures  (including critical care time) Medical Decision Making / ED Course   Medical Decision Making Amount and/or Complexity of Data Reviewed Labs: ordered. Decision-making details documented in ED Course. ECG/medicine tests: ordered and independent interpretation performed. Decision-making details documented in ED Course.    Brief confusion and headache Differential diagnosis considered  Confusion was associated to not wearing her CPAP.  Likely secondary to OSA hypoxia.  Her severe headache  is similar to her prior headaches and resolved with her typical medication.  No focal deficits concerning for CVA.  No need for advanced imaging at this time.  No infections concerning for meningitis.  EKG reassuring without acute ischemic changes, dysrhythmias or blocks.  CBC without leukocytosis or anemia.  Metabolic panel without significant electrolyte derangements or renal sufficiency.  UPT negative ruling out pregnancy related process.    Final Clinical Impression(s) / ED Diagnoses Final diagnoses:  Bad headache   The patient appears reasonably screened and/or stabilized for discharge and I doubt any other medical condition or other [facility] requiring further screening, evaluation, or treatment in the ED at this time. I have discussed the findings, Dx and Tx plan with the patient/family who expressed understanding and agree(s) with the plan. Discharge instructions discussed at length. The patient/family was given strict return precautions who verbalized understanding of the instructions. No further questions at time of discharge.  Disposition: Discharge  Condition: Good  ED Discharge Orders     None      Elevated  Follow Up: [place], Arnaz, FNP [address] [place] [address] [phone]  Call  to schedule an  appointment for close follow up    This chart was dictated using voice recognition software.  Despite best efforts to proofread,  errors can occur which can change the documentation meaning.    Trine Raynell Moder, MD 05/12/24 [phone]

## 2024-05-12 NOTE — ED Notes (Signed)
 Pt aware of the need for a urine... Pt unable to currently provide a sample.SABRASABRA

## 2024-05-13 LAB — CBG MONITORING, ED: Glucose-Capillary: 84 mg/dL (ref 70–99)

## 2024-05-14 ENCOUNTER — Other Ambulatory Visit: Payer: Self-pay

## 2024-05-14 ENCOUNTER — Encounter (HOSPITAL_COMMUNITY): Payer: Self-pay | Admitting: *Deleted

## 2024-05-14 NOTE — Progress Notes (Signed)
 PCP - [place], FNP Cardiologist - none Bariatrics -  [facility] Neurology - Dr True Mar  Chest x-ray - 09/09/23 EKG - 05/12/24 Stress Test - n/a ECHO - n/a Cardiac Cath - n/a  ICD Pacemaker/Loop - n/a  Sleep Study -  Use CPAP nightly.   Diabetes - n/a  Aspirin & Blood Thinner Instructions:  n/a  NPO  Anesthesia review: no  STOP now taking any Aspirin (unless otherwise instructed by your surgeon), Aleve , Naproxen , Ibuprofen , Motrin , Advil , Goody's, BC's, all herbal medications, fish oil, and all vitamins.   Coronavirus Screening Do you have any of the following symptoms:  Cough yes/no: No Fever (>100.7F)  yes/no: No Runny nose yes/no: No Sore throat yes/no: No Difficulty breathing/shortness of breath  yes/no: No  Have you traveled in the last 14 days and where? yes/no: No  Patient verbalized understanding of instructions that were given via phone.

## 2024-05-15 ENCOUNTER — Ambulatory Visit (HOSPITAL_COMMUNITY): Payer: Self-pay | Admitting: Anesthesiology

## 2024-05-15 ENCOUNTER — Encounter (HOSPITAL_COMMUNITY): Payer: Self-pay

## 2024-05-15 ENCOUNTER — Ambulatory Visit (HOSPITAL_COMMUNITY)
Admission: RE | Admit: 2024-05-15 | Discharge: 2024-05-15 | Disposition: A | Discharge status: Home / Self Care | Source: Ambulatory Visit | Attending: Neurology | Admitting: Neurology

## 2024-05-15 ENCOUNTER — Encounter (HOSPITAL_COMMUNITY)
Admission: RE | Disposition: A | Discharge status: Home / Self Care | Payer: Self-pay | Source: Home / Self Care | Attending: Nurse Practitioner

## 2024-05-15 ENCOUNTER — Ambulatory Visit (HOSPITAL_COMMUNITY)
Admission: RE | Admit: 2024-05-15 | Discharge: 2024-05-15 | Disposition: A | Discharge status: Home / Self Care | Attending: Nurse Practitioner | Admitting: Nurse Practitioner

## 2024-05-15 ENCOUNTER — Ambulatory Visit (HOSPITAL_COMMUNITY)
Admission: RE | Admit: 2024-05-15 | Discharge: 2024-05-15 | Disposition: A | Discharge status: Home / Self Care | Source: Ambulatory Visit | Attending: Podiatry | Admitting: Podiatry

## 2024-05-15 ENCOUNTER — Ambulatory Visit (HOSPITAL_BASED_OUTPATIENT_CLINIC_OR_DEPARTMENT_OTHER): Payer: Self-pay | Admitting: Anesthesiology

## 2024-05-15 DIAGNOSIS — R351 Nocturia: Secondary | ICD-10-CM

## 2024-05-15 DIAGNOSIS — E66813 Obesity, class 3: Secondary | ICD-10-CM | POA: Diagnosis not present

## 2024-05-15 DIAGNOSIS — R519 Headache, unspecified: Secondary | ICD-10-CM

## 2024-05-15 DIAGNOSIS — R0683 Snoring: Secondary | ICD-10-CM | POA: Diagnosis not present

## 2024-05-15 DIAGNOSIS — Z9189 Other specified personal risk factors, not elsewhere classified: Secondary | ICD-10-CM | POA: Diagnosis not present

## 2024-05-15 DIAGNOSIS — M7662 Achilles tendinitis, left leg: Secondary | ICD-10-CM

## 2024-05-15 DIAGNOSIS — Z6841 Body Mass Index (BMI) 40.0 and over, adult: Secondary | ICD-10-CM

## 2024-05-15 DIAGNOSIS — R55 Syncope and collapse: Secondary | ICD-10-CM | POA: Diagnosis present

## 2024-05-15 DIAGNOSIS — G932 Benign intracranial hypertension: Secondary | ICD-10-CM | POA: Diagnosis not present

## 2024-05-15 DIAGNOSIS — H538 Other visual disturbances: Secondary | ICD-10-CM | POA: Diagnosis not present

## 2024-05-15 DIAGNOSIS — R42 Dizziness and giddiness: Secondary | ICD-10-CM | POA: Insufficient documentation

## 2024-05-15 DIAGNOSIS — R0681 Apnea, not elsewhere classified: Secondary | ICD-10-CM

## 2024-05-15 DIAGNOSIS — G4719 Other hypersomnia: Secondary | ICD-10-CM | POA: Diagnosis not present

## 2024-05-15 DIAGNOSIS — I10 Essential (primary) hypertension: Secondary | ICD-10-CM

## 2024-05-15 HISTORY — DX: Sleep apnea, unspecified: G47.30

## 2024-05-15 HISTORY — DX: Headache, unspecified: R51.9

## 2024-05-15 HISTORY — PX: RADIOLOGY WITH ANESTHESIA: SHX6223

## 2024-05-15 HISTORY — DX: Prediabetes: R73.03

## 2024-05-15 LAB — POCT PREGNANCY, URINE: Preg Test, Ur: NEGATIVE

## 2024-05-15 SURGERY — RADIOLOGY WITH ANESTHESIA
Anesthesia: General

## 2024-05-15 MED ORDER — FENTANYL CITRATE (PF) 100 MCG/2ML IJ SOLN
INTRAMUSCULAR | Status: AC
  Filled 2024-05-15: qty 2

## 2024-05-15 MED ORDER — ACETAMINOPHEN 10 MG/ML IV SOLN
INTRAVENOUS | Status: AC
  Filled 2024-05-15: qty 100

## 2024-05-15 MED ORDER — MIDAZOLAM HCL 2 MG/2ML IJ SOLN
INTRAMUSCULAR | Status: DC | PRN
  Administered 2024-05-15: 2 mg via INTRAVENOUS

## 2024-05-15 MED ORDER — PROPOFOL 10 MG/ML IV BOLUS
INTRAVENOUS | Status: DC | PRN
  Administered 2024-05-15: 200 mg via INTRAVENOUS
  Administered 2024-05-15: 100 mg via INTRAVENOUS

## 2024-05-15 MED ORDER — SUCCINYLCHOLINE CHLORIDE 200 MG/10ML IV SOSY
PREFILLED_SYRINGE | INTRAVENOUS | Status: DC | PRN
  Administered 2024-05-15: 200 mg via INTRAVENOUS

## 2024-05-15 MED ORDER — FENTANYL CITRATE (PF) 100 MCG/2ML IJ SOLN
25.0000 ug | INTRAMUSCULAR | Status: DC | PRN
  Administered 2024-05-15: 25 ug via INTRAVENOUS

## 2024-05-15 MED ORDER — ROCURONIUM BROMIDE 10 MG/ML (PF) SYRINGE
PREFILLED_SYRINGE | INTRAVENOUS | Status: DC | PRN
  Administered 2024-05-15 (×2): 20 mg via INTRAVENOUS

## 2024-05-15 MED ORDER — ACETAMINOPHEN 10 MG/ML IV SOLN
1000.0000 mg | Freq: Once | INTRAVENOUS | Status: DC | PRN
  Administered 2024-05-15: 1000 mg via INTRAVENOUS

## 2024-05-15 MED ORDER — MIDAZOLAM HCL 2 MG/2ML IJ SOLN
INTRAMUSCULAR | Status: AC
  Filled 2024-05-15: qty 2

## 2024-05-15 MED ORDER — OXYCODONE HCL 5 MG PO TABS: 5.0000 mg | ORAL_TABLET | Freq: Once | ORAL | Status: DC | PRN

## 2024-05-15 MED ORDER — DROPERIDOL 2.5 MG/ML IJ SOLN
0.6250 mg | Freq: Once | INTRAMUSCULAR | Status: AC | PRN
  Administered 2024-05-15: 0.625 mg via INTRAVENOUS

## 2024-05-15 MED ORDER — CHLORHEXIDINE GLUCONATE 0.12 % MT SOLN: 15.0000 mL | Freq: Once | OROMUCOSAL | Status: AC

## 2024-05-15 MED ORDER — ORAL CARE MOUTH RINSE: 15.0000 mL | Freq: Once | OROMUCOSAL | Status: AC

## 2024-05-15 MED ORDER — ONDANSETRON HCL 4 MG/2ML IJ SOLN
4.0000 mg | Freq: Once | INTRAMUSCULAR | Status: AC | PRN
  Administered 2024-05-15: 4 mg via INTRAVENOUS

## 2024-05-15 MED ORDER — DEXAMETHASONE SODIUM PHOSPHATE 10 MG/ML IJ SOLN
INTRAMUSCULAR | Status: DC | PRN
  Administered 2024-05-15: 5 mg via INTRAVENOUS

## 2024-05-15 MED ORDER — OXYCODONE HCL 5 MG/5ML PO SOLN: 5.0000 mg | Freq: Once | ORAL | Status: DC | PRN

## 2024-05-15 MED ORDER — SUGAMMADEX SODIUM 200 MG/2ML IV SOLN
INTRAVENOUS | Status: DC | PRN
  Administered 2024-05-15 (×2): 200 mg via INTRAVENOUS

## 2024-05-15 MED ORDER — CHLORHEXIDINE GLUCONATE 0.12 % MT SOLN
OROMUCOSAL | Status: AC
  Administered 2024-05-15: 15 mL via OROMUCOSAL
  Filled 2024-05-15: qty 15

## 2024-05-15 MED ORDER — GADOBUTROL 1 MMOL/ML IV SOLN
10.0000 mL | Freq: Once | INTRAVENOUS | Status: AC | PRN
Start: 2024-05-15 — End: 2024-05-15
  Administered 2024-05-15: 10 mL via INTRAVENOUS

## 2024-05-15 MED ORDER — LACTATED RINGERS IV SOLN: INTRAVENOUS | Status: DC

## 2024-05-15 MED ORDER — DROPERIDOL 2.5 MG/ML IJ SOLN
INTRAMUSCULAR | Status: AC
  Filled 2024-05-15: qty 2

## 2024-05-15 NOTE — Progress Notes (Signed)
 Late entry for 0733  Dr. Keneth made aware of patient taking Mounjaro on Monday 22nd.

## 2024-05-15 NOTE — Anesthesia Procedure Notes (Signed)
 Procedure Name: Intubation Date/Time: 05/15/2024 9:01 AM  Performed by: Wynonia Alfonzo LABOR, CRNAPre-anesthesia Checklist: Patient identified, Emergency Drugs available, Suction available and Patient being monitored Patient Re-evaluated:Patient Re-evaluated prior to induction Oxygen Delivery Method: Circle System Utilized Preoxygenation: Pre-oxygenation with 100% oxygen Induction Type: IV induction Ventilation: Unable to mask ventilate Laryngoscope Size: Glidescope and 4 Grade View: Grade II Tube type: Oral Tube size: 7.5 mm Number of attempts: 1 Airway Equipment and Method: Stylet and Oral airway Placement Confirmation: ETT inserted through vocal cords under direct vision, positive ETCO2 and breath sounds checked- equal and bilateral Secured at: 21 cm Tube secured with: Tape Dental Injury: Teeth and Oropharynx as per pre-operative assessment

## 2024-05-15 NOTE — Anesthesia Postprocedure Evaluation (Signed)
 Anesthesia Post Note  Patient: Chloe Castillo  Procedure(s) Performed: RADIOLOGY WITH ANESTHESIA MR BRAIN W WO CONTRAST     Patient location during evaluation: PACU Anesthesia Type: General Level of consciousness: awake and alert Pain management: pain level controlled Vital Signs Assessment: post-procedure vital signs reviewed and stable Respiratory status: spontaneous breathing, nonlabored ventilation, respiratory function stable and patient connected to nasal cannula oxygen Cardiovascular status: blood pressure returned to baseline and stable Postop Assessment: no apparent nausea or vomiting Anesthetic complications: no   No notable events documented.  Last Vitals:  Vitals:   05/15/24 1100 05/15/24 1115  BP: 126/70 116/74  Pulse: 75 70  Resp: 12 13  Temp:  36.5 C  SpO2: 95% 95%    Last Pain:  Vitals:   05/15/24 1042  TempSrc:   PainSc: 8                  Lynwood MARLA Cornea

## 2024-05-15 NOTE — Transfer of Care (Signed)
 Immediate Anesthesia Transfer of Care Note  Patient: Chloe Castillo  Procedure(s) Performed: RADIOLOGY WITH ANESTHESIA MR BRAIN W WO CONTRAST  Patient Location: PACU  Anesthesia Type:General  Level of Consciousness: awake, alert , and oriented  Airway & Oxygen Therapy: Patient Spontanous Breathing  Post-op Assessment: Report given to RN and Post -op Vital signs reviewed and stable  Post vital signs: Reviewed and stable  Last Vitals:  Vitals Value Taken Time  BP 145/109 05/15/24 10:30  Temp 36.4 C 05/15/24 10:25  Pulse 71 05/15/24 10:34  Resp 14 05/15/24 10:34  SpO2 95 % 05/15/24 10:34  Vitals shown include unfiled device data.  Last Pain:  Vitals:   05/15/24 1025  TempSrc:   PainSc: 0-No pain         Complications: No notable events documented.

## 2024-05-15 NOTE — Anesthesia Preprocedure Evaluation (Addendum)
 Anesthesia Evaluation  Patient identified by MRN, date of birth, ID band Patient awake    Reviewed: Allergy & Precautions, NPO status , Patient's Chart, lab work & pertinent test results, reviewed documented beta blocker date and time   History of Anesthesia Complications Negative for: history of anesthetic complications  Airway Mallampati: II  TM Distance: >3 FB     Dental no notable dental hx.    Pulmonary sleep apnea and Continuous Positive Airway Pressure Ventilation    breath sounds clear to auscultation       Cardiovascular hypertension,  Rhythm:Regular Rate:Normal     Neuro/Psych  Headaches    GI/Hepatic ,neg GERD  ,,(+) neg Cirrhosis        Endo/Other    Class 4 obesity  Renal/GU Renal disease     Musculoskeletal   Abdominal   Peds  Hematology   Anesthesia Other Findings   Reproductive/Obstetrics                              Anesthesia Physical Anesthesia Plan  ASA: 3  Anesthesia Plan: General   Post-op Pain Management:    Induction: Intravenous  PONV Risk Score and Plan: 3 and Ondansetron  and Dexamethasone   Airway Management Planned: Oral ETT and Video Laryngoscope Planned  Additional Equipment:   Intra-op Plan:   Post-operative Plan: Extubation in OR  Informed Consent: I have reviewed the patients History and Physical, chart, labs and discussed the procedure including the risks, benefits and alternatives for the proposed anesthesia with the patient or authorized representative who has indicated his/her understanding and acceptance.     Dental advisory given  Plan Discussed with: CRNA  Anesthesia Plan Comments: (Plan midaz/fentanyl  sedation for ankle MRI, then conversion to GETA for brain)        Anesthesia Quick Evaluation

## 2024-05-16 ENCOUNTER — Telehealth: Payer: Self-pay | Admitting: Neurology

## 2024-05-16 ENCOUNTER — Encounter (HOSPITAL_COMMUNITY): Payer: Self-pay | Admitting: Radiology

## 2024-05-16 NOTE — Telephone Encounter (Signed)
 Please call patient regarding her recent brain MRI.  There were no acute findings and chronic findings were seen that were previously also seen on a head CT including a partially empty sella which we discussed during the visit in March 2025.   Please go over the AVS/plan with her again from that visit and ask her if she has been seen by an optometrist or ophthalmologist for a full, dilated eye examination to see if there is any pressure behind the eyes on the eye nerves?  I had talked to her about further workup at length in March, she recently did have her sleep study.   Please encourage her to continue to work on weight loss.  If need be, I recommend that she talk to her PCP about a referral to medical weight management.  It looks like she is being seen by bariatrics through [facility].  Please also remind patient to limit caffeine to up to 1 to maybe 2 servings per day on average.

## 2024-05-20 NOTE — Telephone Encounter (Addendum)
;  I called pt and let her know the MRI results.  No acute changes, still with CT findings (partially empty sella).  She said she did see eye doctor, she could not say who, )[facility] [employer]).  She said that they did check , she could not say they said she had optic nerve pressure , or was not sure if eyes had been dilated.  I told her to check with them and fax us  over the exam.  She mentions to me some memory loss, she has to get her eye glasses changed (the bifocals bother her) but when she wears them she says headache/vision better.  I told her that from last visit that was in March 2025 with Dr. Buck and her AVS did mention about getting eye exam/ fully dilated exam to check for pressure.  To diagnose IIH would need to have LP.  Important also to limit caffiene.  She will call about eye exam. And let us  know.  Also relayed about weight loss (continue course relating to this as well).

## 2024-05-22 ENCOUNTER — Ambulatory Visit: Payer: Self-pay | Admitting: Podiatry

## 2024-05-23 ENCOUNTER — Ambulatory Visit (INDEPENDENT_AMBULATORY_CARE_PROVIDER_SITE_OTHER): Admitting: Podiatry

## 2024-05-23 DIAGNOSIS — M7662 Achilles tendinitis, left leg: Secondary | ICD-10-CM | POA: Diagnosis not present

## 2024-05-23 NOTE — Progress Notes (Signed)
  Subjective:  Patient ID: Chloe Castillo, female    DOB: August 12, 1981,  MRN: [id number] Chief Complaint  Patient presents with   Follow-up    follow-up to discuss MRI. NIDDM A1C 5.5. Achilles Tendonitis left.     History of Present Illness The patient, with a history of a work-related foot injury, presents for follow-up evaluation of chronic left heel pain.  She states that she would have 1 physical therapy session but since then she has not been back because she had to call back to schedule and there was a scheduling issue.  She states that her pain is intermittent.  No recent injuries or changes otherwise since I saw her last.  No other concerns.  Objective:    Physical Exam General: AAO x3, NAD  Dermatological: Skin is warm, dry and supple bilateral.  There are no open sores, no preulcerative lesions, no rash or signs of infection present.  Vascular: Dorsalis Pedis artery and Posterior Tibial artery pedal pulses are 2/4 bilateral with immedate capillary fill time.  There is no pain with calf compression, swelling, warmth, erythema.   Neruologic: Grossly intact via light touch bilateral.  Negative Tinel sign.  Musculoskeletal: There mild tenderness along the posterior aspect the calcaneus on the distal portion of the Achilles tendon and along the attachment.  Clinically the tendon appears to be intact without any deficit.  Thompson test is negative.  Minimal edema.  There is no erythema.  Equinus is still present. There is no significant pain on plantar fascia and plantar aspect of heel.  There is no pain with lateral compression of calcaneus.  Decreased medial arch height.    No images are attached to the encounter.      Assessment:   1. Tendonitis, Achilles, left      Plan:  Patient was evaluated and treated and all questions answered.  Assessment and Plan Assessment & Plan  - Reviewed MRI - At this time recommend continue with physical therapy.  She is only had 1 session.   Continue with ankle brace as needed as well as home stretching and rehab exercises.  Continue supportive shoe gear although she wears flip-flops today. -Did discuss surgical intervention if needed.  Donnice JONELLE Fees DPM

## 2024-05-23 NOTE — Patient Instructions (Signed)

## 2024-06-04 ENCOUNTER — Ambulatory Visit: Admitting: Neurology

## 2024-06-04 ENCOUNTER — Telehealth: Payer: Self-pay

## 2024-06-04 ENCOUNTER — Encounter: Payer: Self-pay | Admitting: Neurology

## 2024-06-04 VITALS — BP 144/90 | HR 74 | Ht 64.0 in | Wt 369.0 lb

## 2024-06-04 DIAGNOSIS — H538 Other visual disturbances: Secondary | ICD-10-CM

## 2024-06-04 DIAGNOSIS — R42 Dizziness and giddiness: Secondary | ICD-10-CM

## 2024-06-04 DIAGNOSIS — G4733 Obstructive sleep apnea (adult) (pediatric): Secondary | ICD-10-CM

## 2024-06-04 DIAGNOSIS — R55 Syncope and collapse: Secondary | ICD-10-CM

## 2024-06-04 DIAGNOSIS — E236 Other disorders of pituitary gland: Secondary | ICD-10-CM

## 2024-06-04 DIAGNOSIS — G4719 Other hypersomnia: Secondary | ICD-10-CM

## 2024-06-04 DIAGNOSIS — Z6841 Body Mass Index (BMI) 40.0 and over, adult: Secondary | ICD-10-CM

## 2024-06-04 NOTE — Telephone Encounter (Signed)
 Referral sent to [facility] [phone]  [phone]

## 2024-06-04 NOTE — Progress Notes (Signed)
 Subjective:    Patient ID: Chloe Castillo, female    DOB: 04-28-1981, 43 y.o.   MRN: [id number]  HPI    Interim history:   Chloe Castillo is a 43 year old female with an underlying medical history of hypertension, fibroids, morbid obesity, who presents for follow-up consultation of her dizziness and lightheadedness as well as sleep apnea.  The patient is unaccompanied today.  I saw her in March 2025 as a referral from the emergency room for near syncope.  She was advised to proceed with a sleep study and a brain MRI.  She was advised to establish with an optometrist or ophthalmologist of her choosing for a full, dilated eye examination, her head CT scan through the emergency room had shown partially empty sella.  Her baseline sleep study from 02/18/2024 showed moderate obstructive sleep apnea with a total AHI of 17.8/h.  She was noted to have lower oxygen saturations in the first part of the night and was placed on supplemental oxygen at 1 L/min for her nocturnal hypoxemia.  The absence of supine sleep as well as absence of REM sleep during the study likely underestimated her sleep disordered breathing.   She was advised to proceed with home AutoPap therapy.  Her set up date was 04/16/2024.  She has a ResMed AirSense 10 AutoSet machine.  Her DME company is [employer].  Today, 06/04/2024: I reviewed her AutoPap compliance data from 04/15/2024 through 05/14/2024, which is a total of 30 days, during which time she used her machine 23 days with percent use days greater than 4 hours at 33% only, residual AHI at goal at 3.9/h, average usage for days on treatment of 3 hours and 30 minutes.  Leak on the higher side but acceptable with the 95th percentile at 16.1 L/min.  In the most recent 30 days she has had similar compliance, average usage of 3 hours and 49 minutes, leak has increased, AHI has mildly increased to 6.7/h.  She reports an episode of near syncope while driving.  She has seen pulmonology recently through  [facility].  I reviewed the office visit note with Dr. Shellia from 05/29/2024.  She is pursuing weight loss surgery with bariatrics.  She has seen an eye doctor, records are not available for my review, she reports that she saw somebody off of [employer].  She will be in touch with them and request records to be sent to us  for my reference.  She reports that she has blurry vision on the left side and got new eyeglasses but does not believe they are helping as much.  She is going to make a follow-up appointment.  She is not sure if she had a full, dilated eye examination.  She is willing to be more consistent with her AutoPap.  She used to use a fullface mask in the first month and that actually sealed better as we could see in her downloads.  She is now using an under the nose style mask which she does realize leaks a little bit and she has to adjust the mask more.  She has not had any new neurological symptoms but has had intermittent sleepiness at the wheel and a feeling of almost passing out.  She never ended up doing an EEG.  I will order it again today.  Previously:  10/30/2023: (She) reports intermittent feeling of lightheadedness for the past 3 weeks.  Reports that she often has a headache with it.  She has not been treated for her  headaches by her PCP yet.  She has establish with a new family medicine primary care but does not know the name of her provider.  She presented to the emergency room on 10/23/2023 with dizziness as well as headache and blurry vision.  I reviewed the emergency room records.  Her blood pressure was elevated at 153/87 at the time.  She had a CT venogram of the head with contrast on 10/23/2023 and I reviewed the results:   IMPRESSION: 1. Limited CTV without convincing evidence of acute intracranial abnormality or dural venous sinus thrombosis. An MRI head with and without contrast may provide better evaluation if clinically warranted. 2. Small left transverse and sigmoid  sinus and partially empty sella, which can be associated with idiopathic intracranial hypertension in the correct clinical setting.   She had a head CT without contrast on 12/05/2019 and I reviewed the results:   IMPRESSION: 1.  No acute intracranial pathology.   2. 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 MRI may be helpful to further evaluate.   She reports snoring and witnessed apneas.  She has woken up with a headache.  She has nocturia about once per average night.  Her Epworth sleepiness score is 15 out of 24, fatigue severity score is 9 out of 63.  She lives with 3 of her 6 children.  She works full-time as an International aid/development worker at [employer].  She is a non-smoker and does not drink alcohol.  She drinks caffeine in the form of sweet tea, about 2 to 3 cups/day, occasional soda, very occasional iced tea.  She drinks about 3 bottles of water per day.  She denies any sudden onset one-sided weakness or numbness or tingling or droopy face or slurring of speech but has had intermittent blurry vision.  She had contact lenses in the past but they were irritating her eyes so she stopped using them.  She did not go back for eyeglasses. She feels that her left eye is worse compared to her right.  She has not had any loss of vision.  She has not passed out or fallen to the ground.  She states that 2 of her daughters have migraines.  She is not aware of any family history of sleep apnea.   I had evaluated her for syncope in the past.  I had ordered a brain MRI but she did not have it done.  I had also ordered a sleep study and EEG, she did not have testing done.  She was advised to follow-up with PCP and discuss referral to a cardiologist.  She was lost to follow-up.    12/10/2019: 43 year old right-handed woman with an underlying medical history of hypertension, and morbid obesity with a BMI of over 50, who presented to the  emergency room after an unwitnessed collapse at work. She had felt flushed and lightheaded and lost consciousness in the bathroom at work; was found by a Cabin crew, EMS was called. No convulsions, no B/B incontinence.     I reviewed the emergency room records from 12/05/2019.  She had a head CT without contrast On 12/05/2019 and I reviewed the results:  IMPRESSION: 1.  No acute intracranial pathology.   2. 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 MRI may be helpful to further evaluate. She was treated symptomatically with fluids.  Laboratory work-up showed  no obvious abnormality with the exception of slightly elevated creatinine at 1.21.  Given the CT abnormality she was advised to proceed with a brain MRI with and without contrast but she declined and opted to get discharged from the emergency room.   She denies any recent one-sided weakness or numbness or tingling or droopy face or slurring of speech.  She has some blurry vision and sometimes she has light sensitivity.  She reports that she has prescription eyeglasses but lost them and has not seen an eye doctor in 12 to 13 years.  She reports that her primary care physician wanted her to have a sleep study.  She does not sleep very well or very much, averages about 4 to 5 hours of sleep on any given night, works long hours as an International aid/development worker at [employer].  She works 10 to 12-hour shifts.  Typically she is at work around 4.  She lives with her children, she is single, her boyfriend lives on the [place].  She has 6 children, ages 27, 30, 20, 62, 68, and 5.  Her 43 year old is in the [employer] and away, otherwise she lives with her 5 children.  She has no night to night nocturia but has woken up with a headache.  She does not drink any sodas, she tries to hydrate well with water.  She has not had any vertiginous symptoms.    Her Past Medical History Is Significant  For: Past Medical History:  Diagnosis Date   Fibroids    Headache    Hypertension    Morbidly obese (HCC)    Pre-diabetes    no meds   Sleep apnea    uses CPAP nightly    Her Past Surgical History Is Significant For: Past Surgical History:  Procedure Laterality Date   NO PAST SURGERIES     RADIOLOGY WITH ANESTHESIA N/A 05/15/2024   Procedure: RADIOLOGY WITH ANESTHESIA;  Surgeon: Radiologist, Medication, MD;  Location: [facility] OR;  Service: Radiology;  Laterality: N/A;  BRAIN MRI WITH AND WITHOUT CONTRAST    Her Family History Is Significant For: Family History  Problem Relation Age of Onset   Hypertension Mother    Hypertension Father    Migraines Daughter    Migraines Daughter    Heart Problems Other     Her Social History Is Significant For: Social History   Socioeconomic History   Marital status: Married    Spouse name: Not on file   Number of children: 6   Years of education: Not on file   Highest education level: Not on file  Occupational History   Not on file  Tobacco Use   Smoking status: Never   Smokeless tobacco: Never  Vaping Use   Vaping status: Never Used  Substance and Sexual Activity   Alcohol use: No   Drug use: No   Sexual activity: Yes    Birth control/protection: None  Other Topics Concern   Not on file  Social History Narrative   Lives at home with her children. Her husband is in the [place]   Right handed   Caffeine: 2 red bull  at most in a day, on average has 1 per day, occasionally will have a soda   Social Drivers of Corporate investment banker Strain: Low Risk  (12/26/2023)   Received from [facility]   Overall Financial Resource Strain (CARDIA)    Difficulty of Paying Living Expenses: Not hard at all  Food Insecurity: Low Risk  (  05/29/2024)   Received from [facility]   Hunger Vital Sign    Within the past 12 months, you worried that your food would run out before you got money to buy more: Never true    Within the past  12 months, the food you bought just didn't last and you didn't have money to get more. : Never true  Transportation Needs: No Transportation Needs (05/29/2024)   Received from [facility]    In the past 12 months, has lack of reliable transportation kept you from medical appointments, meetings, work or from getting things needed for daily living? : No  Physical Activity: Not on file  Stress: Not on file  Social Connections: Not on file    Her Allergies Are:  Allergies  Allergen Reactions   Pollen Extract   :   Her Current Medications Are:  Outpatient Encounter Medications as of 06/04/2024  Medication Sig   acetaminophen  (TYLENOL ) 500 MG tablet Take 1,000 mg by mouth every 6 (six) hours as needed (headache).   celecoxib  (CELEBREX ) 100 MG capsule Take 1 capsule (100 mg total) by mouth 2 (two) times daily.   diclofenac  Sodium (VOLTAREN ) 1 % GEL Apply 2 g topically 4 (four) times daily.   losartan (COZAAR) 100 MG tablet Take 100 mg by mouth daily.   methylPREDNISolone  (MEDROL  DOSEPAK) 4 MG TBPK tablet Take as directed   nitroGLYCERIN  (NITRO-DUR ) 0.2 mg/hr patch Place 1 patch (0.2 mg total) onto the skin daily. Apply to Achilles tendon   Semaglutide -Weight Management (WEGOVY ) 0.25 MG/0.5ML SOAJ Inject 0.25 mg into the skin once a week.   Semaglutide -Weight Management (WEGOVY ) 0.25 MG/0.5ML SOAJ Inject 0.25 mg into the skin once a week.   Semaglutide -Weight Management (WEGOVY ) 0.5 MG/0.5ML SOAJ Inject 0.5 mLs (0.5 mg dose) into the skin once a week.   Semaglutide -Weight Management (WEGOVY ) 0.5 MG/0.5ML SOAJ Inject 0.5 mg into the skin once a week.   [DISCONTINUED] lisinopril  (ZESTRIL ) 20 MG tablet Take 1 tablet by mouth daily.   No facility-administered encounter medications on file as of 06/04/2024.  :  Review of Systems:  Out of a complete 14 point review of systems, all are reviewed and negative with the exception of these symptoms as listed below:  Review of  Systems  Neurological:        Rm 9 alone Pt is well, reports she had an episode of either falling asleep or blacking out while taking her son to school. She has had several similar episodes. Otherwise she is stable. No other concerns.        Objective:   Physical Exam  Physical Examination:   Vitals:   06/04/24 0915  BP: (!) 144/90  Pulse: 74    General Examination: The patient is a very pleasant 43 y.o. female in no acute distress. She appears well-developed and well-nourished and well groomed.   HEENT: Normocephalic, atraumatic, pupils are equal, round and reactive to light and accommodation. Funduscopic is difficult and there is questionable hazy disc margins upon my exam but I cannot be fully sure.  Extraocular tracking is good without limitation to gaze excursion or nystagmus noted. Normal smooth pursuit is noted. Hearing is grossly intact. Face is symmetric with normal facial animation. Speech is clear with no dysarthria noted. There is no hypophonia. There is no lip, neck/head, jaw or voice tremor. Neck is supple with full range of passive and active motion. There are no carotid bruits on auscultation. Oropharynx exam reveals: mild mouth dryness,  adequate dental hygiene and unchanged airway crowding.  Tongue protrudes centrally and palate elevates symmetrically.     Chest: Clear to auscultation without wheezing, rhonchi or crackles noted.   Heart: S1+S2+0, regular and normal without murmurs, rubs or gallops noted.    Abdomen: Soft, non-tender and non-distended with normal bowel sounds appreciated on auscultation.   Extremities: There is trace edema in both lower extremities, left ankle/foot in a brace.    Skin: Warm and dry without trophic changes noted.   Musculoskeletal: exam reveals no obvious joint deformities, left foot in a brace.  Neurologically:  Mental status: The patient is awake, alert and oriented in all 4 spheres. Her immediate and remote memory, attention,  language skills and fund of knowledge are appropriate. There is no evidence of aphasia, agnosia, apraxia or anomia. Speech is clear with normal prosody and enunciation. Thought process is linear. Mood is normal and affect is normal.  Cranial nerves II - XII are as described above under HEENT exam. In addition: shoulder shrug is normal with equal shoulder height noted. Motor exam: Normal bulk, strength and tone is noted.  Limited range of motion left foot, we did not remove her brace for this exam.  Romberg not tested.    Fine motor skills and coordination: intact grossly in the upper and right lower extremity.  Cerebellar testing: No dysmetria or intention tremor on finger to nose testing, no truncal or gait ataxia.  Sensory exam: intact to light touch in the upper and lower extremities.  Gait, station and balance: She stands slowly and walks slowly due to her brace.     Assessment and Plan:    In summary, Chloe Castillo is a 43 year old female with an underlying medical history of hypertension, fibroids, morbid obesity, who presents for follow-up consultation of her dizziness and lightheadedness as well as sleep apnea.  She reports seeing an eye doctor recently but needs to go back due to ongoing blurry vision and she is not sure if she had a full, dilated eye examination, she is advised to asked them to send the records and if need be I will make a referral to ophthalmology.  Her head CT scan through the emergency room had shown partially empty sella.   Her baseline sleep study from 02/18/2024 showed moderate obstructive sleep apnea with a total AHI of 17.8/h.  She was noted to have lower oxygen saturations in the first part of the night and was placed on supplemental oxygen at 1 L/min for her nocturnal hypoxemia.  The absence of supine sleep as well as absence of REM sleep during the study likely underestimated her sleep disordered breathing.  She has been on home AutoPap therapy since end of August.   She is not quite there yet with her compliance but does note some benefit and is committed to continuing with treatment. She is working on weight loss.   She has a history of presyncopal symptoms and residual daytime somnolence.  I had previously ordered an EEG.  She never ended up having it.  I will reorder an EEG and explained the procedure to her.  She is in agreement.  This was an extended visit of over 45 minutes with considerable counseling and coordination of care, copious record review and addressing multiple issues. Below is a summary of my recommendations and our discussion points from today's visit, based on chart review, history and examination. They were given these instructions verbally during the visit in detail and also in writing in the  MyChart after visit summary (AVS), which they can access electronically.       << Please continue using your autoPAP regularly. While your insurance requires that you use PAP at least 4 hours each night on 70% of the nights, I recommend, that you not skip any nights and use it throughout the night if you can. Getting used to PAP and staying with the treatment long term does take time and patience and discipline. Untreated obstructive sleep apnea when it is moderate to severe can have an adverse impact on cardiovascular health and raise her risk for heart disease, arrhythmias, hypertension, congestive heart failure, stroke and diabetes. Untreated obstructive sleep apnea causes sleep disruption, nonrestorative sleep, and sleep deprivation. This can have an impact on your day to day functioning and cause daytime sleepiness and impairment of cognitive function, memory loss, mood disturbance, and problems focussing. Using PAP regularly can improve these symptoms. Please get a full, dilated eye examination and asked your eye doctors to send us  the records from your last exam.  I can make a referral if need be to an ophthalmologist or optometrist if you need 1.   Please let us  know who you have seen so we can send a referral to them or another office.  Remember, we had talked about your empty sella which is a finding on a brain scan that can be associated with a condition called intracranial hypertension which means that there could be elevated fluid pressure around the brain.  Your eye exam is critical for this condition to be monitored and diagnosed.  We may consider lumbar puncture depending on your eye findings. We will do an EEG (brainwave test), which we will schedule. We will call you with the results. Please continue to work on weight loss. We will plan a follow-up in about 3 months, sooner if needed.>> I answered all her questions today and she was in agreement with the plan.

## 2024-06-04 NOTE — Patient Instructions (Addendum)
 Please continue using your autoPAP regularly. While your insurance requires that you use PAP at least 4 hours each night on 70% of the nights, I recommend, that you not skip any nights and use it throughout the night if you can. Getting used to PAP and staying with the treatment long term does take time and patience and discipline. Untreated obstructive sleep apnea when it is moderate to severe can have an adverse impact on cardiovascular health and raise her risk for heart disease, arrhythmias, hypertension, congestive heart failure, stroke and diabetes. Untreated obstructive sleep apnea causes sleep disruption, nonrestorative sleep, and sleep deprivation. This can have an impact on your day to day functioning and cause daytime sleepiness and impairment of cognitive function, memory loss, mood disturbance, and problems focussing. Using PAP regularly can improve these symptoms. Please get a full, dilated eye examination and asked your eye doctors to send us  the records from your last exam.  I can make a referral if need be to an ophthalmologist or optometrist if you need 1.  Please let us  know who you have seen so we can send a referral to them or another office.  Remember, we had talked about your empty sella which is a finding on a brain scan that can be associated with a condition called intracranial hypertension which means that there could be elevated fluid pressure around the brain.  Your eye exam is critical for this condition to be monitored and diagnosed.  We may consider lumbar puncture depending on your eye findings. We will do an EEG (brainwave test), which we will schedule. We will call you with the results. Please continue to work on weight loss. We will plan a follow-up in about 3 months, sooner if needed.

## 2024-06-18 ENCOUNTER — Encounter: Payer: Self-pay | Admitting: Neurology

## 2024-06-19 ENCOUNTER — Ambulatory Visit: Payer: Self-pay | Admitting: Neurology

## 2024-06-19 ENCOUNTER — Telehealth: Payer: Self-pay | Admitting: Neurology

## 2024-06-19 ENCOUNTER — Ambulatory Visit (INDEPENDENT_AMBULATORY_CARE_PROVIDER_SITE_OTHER): Admitting: Neurology

## 2024-06-19 DIAGNOSIS — G4733 Obstructive sleep apnea (adult) (pediatric): Secondary | ICD-10-CM

## 2024-06-19 DIAGNOSIS — R55 Syncope and collapse: Secondary | ICD-10-CM

## 2024-06-19 DIAGNOSIS — R42 Dizziness and giddiness: Secondary | ICD-10-CM

## 2024-06-19 DIAGNOSIS — G4719 Other hypersomnia: Secondary | ICD-10-CM

## 2024-06-19 DIAGNOSIS — E236 Other disorders of pituitary gland: Secondary | ICD-10-CM

## 2024-06-19 NOTE — Telephone Encounter (Signed)
 I received optometry records from [facility].  Patient saw Dr. Garrel Satchel on 11/26/2023.  I reviewed the visit note and impression as well as recommendations.  There was no evidence of papilledema.  Diagnoses: Regular astigmatism, bilateral.  Myopia left eye.  Presbyopia.  She was prescribed new eyeglasses.  I will have the full report scanned into her chart.  FYI, nothing further needed.

## 2024-06-19 NOTE — Procedures (Signed)
   History:  43 year old woman with events concerning for seizures  EEG classification:  Awake and asleep  Duration: 26 minutes   Technical aspects: This EEG study was done with scalp electrodes positioned according to the 10-20 International system of electrode placement. Electrical activity was reviewed with band pass filter of 1-70Hz , sensitivity of 7 uV/mm, display speed of 62mm/sec with a 60Hz  notched filter applied as appropriate. EEG data were recorded continuously and digitally stored.   Description of the recording: The background rhythms of this recording consists of a fairly well modulated medium amplitude background activity of 9 Hz. As the record progresses, the patient initially is in the waking state, but appears to enter the early stage II sleep during the recording, with rudimentary sleep spindles and vertex sharp wave activity seen. During the wakeful state, photic stimulation was performed, and no abnormal responses were seen. Hyperventilation was also performed, no abnormal response seen. No epileptiform discharges seen during this recording. There was no focal slowing.   Abnormality: None   Impression: This is a normal awake and sleep EEG. No evidence of interictal epileptiform discharges. Normal EEGs, however, do not rule out epilepsy.    Hjalmer Iovino, MD [facility]

## 2024-07-24 ENCOUNTER — Ambulatory Visit: Admitting: Podiatry

## 2024-07-25 ENCOUNTER — Ambulatory Visit: Admitting: Podiatry

## 2024-09-05 ENCOUNTER — Emergency Department (HOSPITAL_COMMUNITY)

## 2024-09-05 ENCOUNTER — Encounter (HOSPITAL_COMMUNITY): Payer: Self-pay

## 2024-09-05 ENCOUNTER — Emergency Department (HOSPITAL_COMMUNITY)
Admission: EM | Admit: 2024-09-05 | Discharge: 2024-09-05 | Disposition: A | Discharge status: Home / Self Care | Attending: Emergency Medicine | Admitting: Emergency Medicine

## 2024-09-05 ENCOUNTER — Other Ambulatory Visit: Payer: Self-pay

## 2024-09-05 DIAGNOSIS — R7989 Other specified abnormal findings of blood chemistry: Secondary | ICD-10-CM | POA: Diagnosis not present

## 2024-09-05 DIAGNOSIS — Y92410 Unspecified street and highway as the place of occurrence of the external cause: Secondary | ICD-10-CM | POA: Insufficient documentation

## 2024-09-05 DIAGNOSIS — R072 Precordial pain: Secondary | ICD-10-CM | POA: Diagnosis not present

## 2024-09-05 DIAGNOSIS — I10 Essential (primary) hypertension: Secondary | ICD-10-CM | POA: Insufficient documentation

## 2024-09-05 DIAGNOSIS — Z79899 Other long term (current) drug therapy: Secondary | ICD-10-CM | POA: Diagnosis not present

## 2024-09-05 DIAGNOSIS — R569 Unspecified convulsions: Secondary | ICD-10-CM | POA: Diagnosis not present

## 2024-09-05 DIAGNOSIS — R079 Chest pain, unspecified: Secondary | ICD-10-CM | POA: Diagnosis present

## 2024-09-05 LAB — CBC WITH DIFFERENTIAL/PLATELET
Abs Immature Granulocytes: 0.03 K/uL (ref 0.00–0.07)
Basophils Absolute: 0 K/uL (ref 0.0–0.1)
Basophils Relative: 0 %
Eosinophils Absolute: 0.1 K/uL (ref 0.0–0.5)
Eosinophils Relative: 2 %
HCT: 38.4 % (ref 36.0–46.0)
Hemoglobin: 12.1 g/dL (ref 12.0–15.0)
Immature Granulocytes: 0 %
Lymphocytes Relative: 35 %
Lymphs Abs: 3 K/uL (ref 0.7–4.0)
MCH: 26 pg (ref 26.0–34.0)
MCHC: 31.5 g/dL (ref 30.0–36.0)
MCV: 82.4 fL (ref 80.0–100.0)
Monocytes Absolute: 0.4 K/uL (ref 0.1–1.0)
Monocytes Relative: 5 %
Neutro Abs: 5 K/uL (ref 1.7–7.7)
Neutrophils Relative %: 58 %
Platelets: 315 K/uL (ref 150–400)
RBC: 4.66 MIL/uL (ref 3.87–5.11)
RDW: 14.7 % (ref 11.5–15.5)
WBC: 8.6 K/uL (ref 4.0–10.5)
nRBC: 0 % (ref 0.0–0.2)

## 2024-09-05 LAB — COMPREHENSIVE METABOLIC PANEL WITH GFR
ALT: 17 U/L (ref 0–44)
AST: 19 U/L (ref 15–41)
Albumin: 3.8 g/dL (ref 3.5–5.0)
Alkaline Phosphatase: 73 U/L (ref 38–126)
Anion gap: 10 (ref 5–15)
BUN: 14 mg/dL (ref 6–20)
CO2: 26 mmol/L (ref 22–32)
Calcium: 8.8 mg/dL — ABNORMAL LOW (ref 8.9–10.3)
Chloride: 101 mmol/L (ref 98–111)
Creatinine, Ser: 1.01 mg/dL — ABNORMAL HIGH (ref 0.44–1.00)
GFR, Estimated: >60 mL/min
Glucose, Bld: 116 mg/dL — ABNORMAL HIGH (ref 70–99)
Potassium: 3.8 mmol/L (ref 3.5–5.1)
Sodium: 137 mmol/L (ref 135–145)
Total Bilirubin: 0.3 mg/dL (ref 0.0–1.2)
Total Protein: 7.2 g/dL (ref 6.5–8.1)

## 2024-09-05 LAB — URINE DRUG SCREEN
Amphetamines: NEGATIVE
Barbiturates: NEGATIVE
Benzodiazepines: NEGATIVE
Cocaine: NEGATIVE
Fentanyl: POSITIVE — AB
Methadone Scn, Ur: NEGATIVE
Opiates: NEGATIVE
Tetrahydrocannabinol: NEGATIVE

## 2024-09-05 LAB — URINALYSIS, ROUTINE W REFLEX MICROSCOPIC
Bilirubin Urine: NEGATIVE
Glucose, UA: NEGATIVE mg/dL
Hgb urine dipstick: NEGATIVE
Ketones, ur: NEGATIVE mg/dL
Leukocytes,Ua: NEGATIVE
Nitrite: NEGATIVE
Protein, ur: NEGATIVE mg/dL
Specific Gravity, Urine: 1.015 (ref 1.005–1.030)
pH: 6 (ref 5.0–8.0)

## 2024-09-05 LAB — TROPONIN T, HIGH SENSITIVITY
Troponin T High Sensitivity: <15 ng/L (ref 0–19)
Troponin T High Sensitivity: <15 ng/L (ref 0–19)

## 2024-09-05 LAB — ETHANOL: Alcohol, Ethyl (B): <15 mg/dL

## 2024-09-05 LAB — HCG, SERUM, QUALITATIVE: Preg, Serum: NEGATIVE

## 2024-09-05 MED ORDER — ACETAMINOPHEN 500 MG PO TABS
1000.0000 mg | ORAL_TABLET | Freq: Once | ORAL | Status: AC
  Administered 2024-09-05: 1000 mg via ORAL
  Filled 2024-09-05: qty 2

## 2024-09-05 MED ORDER — HYDROMORPHONE HCL 1 MG/ML IJ SOLN
0.5000 mg | Freq: Once | INTRAMUSCULAR | Status: AC
  Administered 2024-09-05: 0.5 mg via INTRAVENOUS
  Filled 2024-09-05: qty 1

## 2024-09-05 MED ORDER — OXYCODONE HCL 5 MG PO TABS: 5.0000 mg | ORAL_TABLET | ORAL | 0 refills | Status: AC | PRN

## 2024-09-05 MED ORDER — METHOCARBAMOL 500 MG PO TABS: 500.0000 mg | ORAL_TABLET | Freq: Two times a day (BID) | ORAL | 0 refills | Status: AC

## 2024-09-05 MED ORDER — LIDOCAINE 5 % EX PTCH
1.0000 | MEDICATED_PATCH | CUTANEOUS | Status: DC
  Administered 2024-09-05: 1 via TRANSDERMAL
  Filled 2024-09-05: qty 1

## 2024-09-05 MED ORDER — FENTANYL CITRATE (PF) 50 MCG/ML IJ SOSY
50.0000 ug | PREFILLED_SYRINGE | Freq: Once | INTRAMUSCULAR | Status: AC
  Administered 2024-09-05: 50 ug via INTRAVENOUS
  Filled 2024-09-05: qty 1
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
## 2024-09-17 ENCOUNTER — Encounter: Payer: Self-pay | Admitting: Neurology

## 2024-09-17 ENCOUNTER — Ambulatory Visit: Admitting: Neurology

## 2024-09-17 VITALS — BP 155/97 | HR 66 | Ht 63.0 in | Wt 353.2 lb

## 2024-09-17 DIAGNOSIS — G4733 Obstructive sleep apnea (adult) (pediatric): Secondary | ICD-10-CM

## 2024-09-17 DIAGNOSIS — G4719 Other hypersomnia: Secondary | ICD-10-CM | POA: Diagnosis not present

## 2024-09-17 DIAGNOSIS — R410 Disorientation, unspecified: Secondary | ICD-10-CM | POA: Diagnosis not present

## 2024-09-17 DIAGNOSIS — R42 Dizziness and giddiness: Secondary | ICD-10-CM | POA: Diagnosis not present
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
## 2024-09-17 NOTE — Patient Instructions (Addendum)
 Please do not drive when feeling sleepy.  I recommend that you not drive until you feel that your symptoms have resolved, you should be symptom free for 6 months. I am not sure if you have seizures.  We have done an EEG last year, about 3 months ago, I am happy to order another EEG. Please get back on your AutoPap machine as you have not used it and you are likely sleepy at the wheel because of untreated sleep apnea.  Follow-up with your pulmonologist since you have established care for your sleep apnea with [facility].  Follow-up with your primary care closely. Please get an updated eye exam. You can seen any optometrist or ophthalmologist of your choosing. Strain on the eyes can exacerbate headaches and dizziness potentially. Please continue to work on weight loss. I do not recommend any new medications as this time. Follow up with the nurse practitioner as scheduled.

## 2024-09-18 ENCOUNTER — Institutional Professional Consult (permissible substitution): Admitting: Neurology

## 2024-09-24 ENCOUNTER — Ambulatory Visit: Admitting: Neurology

## 2024-09-24 DIAGNOSIS — R4182 Altered mental status, unspecified: Secondary | ICD-10-CM | POA: Diagnosis not present

## 2024-09-24 DIAGNOSIS — G4719 Other hypersomnia: Secondary | ICD-10-CM

## 2024-09-24 DIAGNOSIS — R410 Disorientation, unspecified: Secondary | ICD-10-CM

## 2024-09-24 DIAGNOSIS — R42 Dizziness and giddiness: Secondary | ICD-10-CM

## 2024-09-24 DIAGNOSIS — G4733 Obstructive sleep apnea (adult) (pediatric): Secondary | ICD-10-CM

## 2024-09-24 NOTE — Procedures (Signed)
" ° ° °  History:  44 year old woman with transient alteration of awareness   EEG classification: Awake and drowsy  Duration: 26 minutes   Technical aspects: This EEG study was done with scalp electrodes positioned according to the 10-20 International system of electrode placement. Electrical activity was reviewed with band pass filter of 1-70Hz , sensitivity of 7 uV/mm, display speed of 7mm/sec with a 60Hz  notched filter applied as appropriate. EEG data were recorded continuously and digitally stored.   Description of the recording: The background rhythms of this recording consists of a fairly well modulated medium amplitude alpha rhythm of 9-10 Hz that is reactive to eye opening and closure. Present in the anterior head region is a 15-20 Hz beta activity. Photic stimulation was performed, did not show any abnormalities. Hyperventilation was also  performed, did not show any abnormalities. Drowsiness was manifested by background fragmentation. No abnormal epileptiform discharges seen during this recording but there was presence of high amplitude sharply contoured waves. There was no focal slowing. There were no electrographic seizure identified.   Abnormality: None   Impression: This is essentially a normal awake and drowsy EEG. There were presence of high amplitude, sharply contoured waves. Consider 72 hours ambulatory EEG. No evidence of interictal epileptiform discharges.   Pastor Falling, MD [facility]  "

## 2024-09-29 ENCOUNTER — Ambulatory Visit: Admitting: Podiatry

## 2025-01-01 ENCOUNTER — Ambulatory Visit: Admitting: Neurology
